# Patient Record
Sex: Female | Born: 1945 | Race: White | Hispanic: No | Marital: Married | Smoking: Former smoker
Health system: Southern US, Community
[De-identification: ages and names within clinical notes are randomized; demographics above are authoritative.]

## PROBLEM LIST (undated history)

## (undated) DIAGNOSIS — L97929 Non-pressure chronic ulcer of unspecified part of left lower leg with unspecified severity: Secondary | ICD-10-CM

## (undated) DIAGNOSIS — S8291XA Unspecified fracture of right lower leg, initial encounter for closed fracture: Secondary | ICD-10-CM

## (undated) DIAGNOSIS — I83029 Varicose veins of left lower extremity with ulcer of unspecified site: Secondary | ICD-10-CM

## (undated) DIAGNOSIS — I129 Hypertensive chronic kidney disease with stage 1 through stage 4 chronic kidney disease, or unspecified chronic kidney disease: Secondary | ICD-10-CM

## (undated) DIAGNOSIS — S32591A Other specified fracture of right pubis, initial encounter for closed fracture: Secondary | ICD-10-CM

## (undated) DIAGNOSIS — S82401A Unspecified fracture of shaft of right fibula, initial encounter for closed fracture: Secondary | ICD-10-CM

## (undated) DIAGNOSIS — I251 Atherosclerotic heart disease of 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: Secondary | ICD-10-CM

## (undated) DIAGNOSIS — K5900 Constipation, unspecified: Secondary | ICD-10-CM

## (undated) DIAGNOSIS — M810 Age-related osteoporosis without current pathological fracture: Secondary | ICD-10-CM

## (undated) DIAGNOSIS — E559 Vitamin D deficiency, unspecified: Secondary | ICD-10-CM

## (undated) DIAGNOSIS — R609 Edema, unspecified: Secondary | ICD-10-CM

## (undated) DIAGNOSIS — G35 Multiple sclerosis: Secondary | ICD-10-CM

## (undated) DIAGNOSIS — E042 Nontoxic multinodular goiter: Secondary | ICD-10-CM

## (undated) DIAGNOSIS — R7303 Prediabetes: Secondary | ICD-10-CM

## (undated) DIAGNOSIS — N183 Chronic kidney disease, stage 3 unspecified: Secondary | ICD-10-CM

## (undated) DIAGNOSIS — N289 Disorder of kidney and ureter, unspecified: Secondary | ICD-10-CM

## (undated) DIAGNOSIS — C50919 Malignant neoplasm of unspecified site of unspecified female breast: Secondary | ICD-10-CM

## (undated) DIAGNOSIS — N879 Dysplasia of cervix uteri, unspecified: Secondary | ICD-10-CM

## (undated) DIAGNOSIS — S82201A Unspecified fracture of shaft of right tibia, initial encounter for closed fracture: Secondary | ICD-10-CM

## (undated) DIAGNOSIS — E039 Hypothyroidism, unspecified: Secondary | ICD-10-CM

## (undated) DIAGNOSIS — E669 Obesity, unspecified: Secondary | ICD-10-CM

## (undated) DIAGNOSIS — D649 Anemia, unspecified: Secondary | ICD-10-CM

## (undated) DIAGNOSIS — E049 Nontoxic goiter, unspecified: Secondary | ICD-10-CM

## (undated) DIAGNOSIS — J209 Acute bronchitis, unspecified: Secondary | ICD-10-CM

## (undated) DIAGNOSIS — E041 Nontoxic single thyroid nodule: Secondary | ICD-10-CM

## (undated) DIAGNOSIS — Z8639 Personal history of other endocrine, nutritional and metabolic disease: Secondary | ICD-10-CM

## (undated) DIAGNOSIS — I10 Essential (primary) hypertension: Secondary | ICD-10-CM

## (undated) HISTORY — DX: Essential (primary) hypertension: I10

## (undated) HISTORY — DX: Non-pressure chronic ulcer of unspecified part of left lower leg with unspecified severity: L97.929

## (undated) HISTORY — DX: Hypertensive chronic kidney disease with stage 1 through stage 4 chronic kidney disease, or unspecified chronic kidney disease: I12.9

## (undated) HISTORY — DX: Acute bronchitis, unspecified: J20.9

## (undated) HISTORY — DX: Hypothyroidism, unspecified: E03.9

## (undated) HISTORY — DX: Unspecified fracture of right lower leg, initial encounter for closed fracture: S82.91XA

## (undated) HISTORY — DX: Personal history of other endocrine, nutritional and metabolic disease: Z86.39

## (undated) HISTORY — DX: Constipation, unspecified: K59.00

## (undated) HISTORY — PX: CATARACT EXTRACTION, BILATERAL: SHX1313

## (undated) HISTORY — DX: Chronic kidney disease, stage 3 unspecified: N18.30

## (undated) HISTORY — DX: Disorder of kidney and ureter, unspecified: N28.9

## (undated) HISTORY — DX: Varicose veins of left lower extremity with ulcer of unspecified site: I83.029

## (undated) HISTORY — DX: Dysplasia of cervix uteri, unspecified: N87.9

## (undated) HISTORY — DX: Prediabetes: R73.03

## (undated) HISTORY — DX: Nontoxic multinodular goiter: E04.2

## (undated) HISTORY — DX: Atherosclerotic heart disease of 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: I25.10

## (undated) HISTORY — DX: Anemia, unspecified: D64.9

## (undated) HISTORY — DX: Other specified fracture of right pubis, initial encounter for closed fracture: S32.591A

## (undated) HISTORY — DX: Unspecified fracture of shaft of right tibia, initial encounter for closed fracture: S82.201A

## (undated) HISTORY — DX: Age-related osteoporosis without current pathological fracture: M81.0

## (undated) HISTORY — DX: Edema, unspecified: R60.9

## (undated) HISTORY — DX: Vitamin D deficiency, unspecified: E55.9

## (undated) HISTORY — DX: Unspecified fracture of shaft of right fibula, initial encounter for closed fracture: S82.401A

## (undated) HISTORY — DX: Malignant neoplasm of unspecified site of unspecified female breast: C50.919

## (undated) HISTORY — PX: OTHER SURGICAL HISTORY: SHX169

## (undated) HISTORY — DX: Nontoxic goiter, unspecified: E04.9

## (undated) HISTORY — DX: Multiple sclerosis: G35

## (undated) HISTORY — DX: Obesity, unspecified: E66.9

---

## 1971-05-09 HISTORY — PX: CERVICAL CONIZATION W/BX: SHX1330

## 1997-09-23 ENCOUNTER — Ambulatory Visit (HOSPITAL_COMMUNITY): Admission: RE | Admit: 1997-09-23 | Discharge: 1997-09-23 | Payer: Self-pay | Admitting: Family Medicine

## 1998-06-25 ENCOUNTER — Other Ambulatory Visit: Admission: RE | Admit: 1998-06-25 | Discharge: 1998-06-25 | Payer: Self-pay | Admitting: Obstetrics and Gynecology

## 1999-05-10 ENCOUNTER — Other Ambulatory Visit: Admission: RE | Admit: 1999-05-10 | Discharge: 1999-05-10 | Payer: Self-pay | Admitting: Radiology

## 1999-06-06 ENCOUNTER — Other Ambulatory Visit: Admission: RE | Admit: 1999-06-06 | Discharge: 1999-06-06 | Payer: Self-pay | Admitting: *Deleted

## 1999-06-08 ENCOUNTER — Encounter (INDEPENDENT_AMBULATORY_CARE_PROVIDER_SITE_OTHER): Payer: Self-pay | Admitting: Specialist

## 1999-06-08 ENCOUNTER — Other Ambulatory Visit: Admission: RE | Admit: 1999-06-08 | Discharge: 1999-06-08 | Payer: Self-pay | Admitting: Radiology

## 1999-11-11 ENCOUNTER — Encounter: Admission: RE | Admit: 1999-11-11 | Discharge: 1999-11-11 | Payer: Self-pay | Admitting: Family Medicine

## 1999-11-11 ENCOUNTER — Encounter: Payer: Self-pay | Admitting: Family Medicine

## 2000-05-08 HISTORY — PX: KNEE ARTHROSCOPY W/ MENISCAL REPAIR: SHX1877

## 2000-06-06 ENCOUNTER — Other Ambulatory Visit: Admission: RE | Admit: 2000-06-06 | Discharge: 2000-06-06 | Payer: Self-pay | Admitting: *Deleted

## 2000-06-13 ENCOUNTER — Encounter (INDEPENDENT_AMBULATORY_CARE_PROVIDER_SITE_OTHER): Payer: Self-pay

## 2000-06-13 ENCOUNTER — Other Ambulatory Visit: Admission: RE | Admit: 2000-06-13 | Discharge: 2000-06-13 | Payer: Self-pay | Admitting: *Deleted

## 2001-02-12 ENCOUNTER — Ambulatory Visit (HOSPITAL_BASED_OUTPATIENT_CLINIC_OR_DEPARTMENT_OTHER): Admission: RE | Admit: 2001-02-12 | Discharge: 2001-02-12 | Payer: Self-pay | Admitting: Orthopaedic Surgery

## 2001-06-19 ENCOUNTER — Other Ambulatory Visit: Admission: RE | Admit: 2001-06-19 | Discharge: 2001-06-19 | Payer: Self-pay | Admitting: *Deleted

## 2002-07-08 ENCOUNTER — Other Ambulatory Visit: Admission: RE | Admit: 2002-07-08 | Discharge: 2002-07-08 | Payer: Self-pay | Admitting: Obstetrics and Gynecology

## 2003-07-09 ENCOUNTER — Other Ambulatory Visit: Admission: RE | Admit: 2003-07-09 | Discharge: 2003-07-09 | Payer: Self-pay | Admitting: Obstetrics and Gynecology

## 2004-07-26 ENCOUNTER — Other Ambulatory Visit: Admission: RE | Admit: 2004-07-26 | Discharge: 2004-07-26 | Payer: Self-pay | Admitting: Obstetrics and Gynecology

## 2005-07-27 ENCOUNTER — Other Ambulatory Visit: Admission: RE | Admit: 2005-07-27 | Discharge: 2005-07-27 | Payer: Self-pay | Admitting: Obstetrics and Gynecology

## 2006-12-06 ENCOUNTER — Encounter (INDEPENDENT_AMBULATORY_CARE_PROVIDER_SITE_OTHER): Payer: Self-pay | Admitting: Surgery

## 2006-12-06 ENCOUNTER — Ambulatory Visit (HOSPITAL_BASED_OUTPATIENT_CLINIC_OR_DEPARTMENT_OTHER): Admission: RE | Admit: 2006-12-06 | Discharge: 2006-12-06 | Payer: Self-pay | Admitting: Surgery

## 2006-12-16 ENCOUNTER — Encounter: Admission: RE | Admit: 2006-12-16 | Discharge: 2006-12-16 | Payer: Self-pay | Admitting: Surgery

## 2006-12-26 ENCOUNTER — Ambulatory Visit: Admission: RE | Admit: 2006-12-26 | Discharge: 2007-01-29 | Payer: Self-pay | Admitting: Radiation Oncology

## 2007-01-23 ENCOUNTER — Ambulatory Visit: Payer: Self-pay | Admitting: Oncology

## 2007-01-28 ENCOUNTER — Encounter (INDEPENDENT_AMBULATORY_CARE_PROVIDER_SITE_OTHER): Payer: Self-pay | Admitting: Surgery

## 2007-01-28 ENCOUNTER — Ambulatory Visit (HOSPITAL_BASED_OUTPATIENT_CLINIC_OR_DEPARTMENT_OTHER): Admission: RE | Admit: 2007-01-28 | Discharge: 2007-01-28 | Payer: Self-pay | Admitting: Surgery

## 2007-02-06 ENCOUNTER — Encounter (HOSPITAL_COMMUNITY): Admission: RE | Admit: 2007-02-06 | Discharge: 2007-03-08 | Payer: Self-pay | Admitting: Oncology

## 2007-02-06 ENCOUNTER — Ambulatory Visit (HOSPITAL_COMMUNITY): Payer: Self-pay | Admitting: Oncology

## 2007-02-06 DIAGNOSIS — C50919 Malignant neoplasm of unspecified site of unspecified female breast: Secondary | ICD-10-CM

## 2007-02-06 HISTORY — PX: MASTECTOMY, RADICAL: SHX710

## 2007-02-06 HISTORY — DX: Malignant neoplasm of unspecified site of unspecified female breast: C50.919

## 2007-02-08 ENCOUNTER — Ambulatory Visit (HOSPITAL_COMMUNITY): Admission: RE | Admit: 2007-02-08 | Discharge: 2007-02-08 | Payer: Self-pay | Admitting: Oncology

## 2007-03-04 ENCOUNTER — Ambulatory Visit (HOSPITAL_COMMUNITY): Admission: RE | Admit: 2007-03-04 | Discharge: 2007-03-05 | Payer: Self-pay | Admitting: Surgery

## 2007-03-04 ENCOUNTER — Encounter (INDEPENDENT_AMBULATORY_CARE_PROVIDER_SITE_OTHER): Payer: Self-pay | Admitting: Surgery

## 2007-03-27 ENCOUNTER — Ambulatory Visit (HOSPITAL_COMMUNITY): Payer: Self-pay | Admitting: Oncology

## 2007-05-16 ENCOUNTER — Encounter: Admission: RE | Admit: 2007-05-16 | Discharge: 2007-05-16 | Payer: Self-pay | Admitting: Endocrinology

## 2007-06-07 ENCOUNTER — Other Ambulatory Visit: Admission: RE | Admit: 2007-06-07 | Discharge: 2007-06-07 | Payer: Self-pay | Admitting: Endocrinology

## 2007-09-09 ENCOUNTER — Ambulatory Visit (HOSPITAL_COMMUNITY): Payer: Self-pay | Admitting: Oncology

## 2008-03-16 ENCOUNTER — Ambulatory Visit (HOSPITAL_COMMUNITY): Payer: Self-pay | Admitting: Oncology

## 2008-09-07 ENCOUNTER — Encounter (HOSPITAL_COMMUNITY): Admission: RE | Admit: 2008-09-07 | Discharge: 2008-10-07 | Payer: Self-pay | Admitting: Oncology

## 2008-09-07 ENCOUNTER — Ambulatory Visit (HOSPITAL_COMMUNITY): Payer: Self-pay | Admitting: Oncology

## 2009-03-31 ENCOUNTER — Ambulatory Visit (HOSPITAL_COMMUNITY): Payer: Self-pay | Admitting: Oncology

## 2009-09-29 ENCOUNTER — Encounter (HOSPITAL_COMMUNITY): Admission: RE | Admit: 2009-09-29 | Discharge: 2009-10-29 | Payer: Self-pay | Admitting: Oncology

## 2009-09-29 ENCOUNTER — Ambulatory Visit (HOSPITAL_COMMUNITY): Payer: Self-pay | Admitting: Oncology

## 2010-03-30 ENCOUNTER — Encounter (HOSPITAL_COMMUNITY)
Admission: RE | Admit: 2010-03-30 | Discharge: 2010-04-29 | Payer: Self-pay | Source: Home / Self Care | Attending: Oncology | Admitting: Oncology

## 2010-03-30 ENCOUNTER — Ambulatory Visit (HOSPITAL_COMMUNITY): Payer: Self-pay | Admitting: Oncology

## 2010-05-29 ENCOUNTER — Encounter: Payer: Self-pay | Admitting: Surgery

## 2010-07-19 LAB — COMPREHENSIVE METABOLIC PANEL
ALT: 16 U/L (ref 0–35)
Alkaline Phosphatase: 61 U/L (ref 39–117)
BUN: 14 mg/dL (ref 6–23)
Chloride: 101 mEq/L (ref 96–112)
Glucose, Bld: 145 mg/dL — ABNORMAL HIGH (ref 70–99)
Potassium: 3.8 mEq/L (ref 3.5–5.1)
Sodium: 138 mEq/L (ref 135–145)
Total Bilirubin: 0.7 mg/dL (ref 0.3–1.2)

## 2010-07-19 LAB — DIFFERENTIAL
Basophils Absolute: 0 10*3/uL (ref 0.0–0.1)
Basophils Relative: 0 % (ref 0–1)
Eosinophils Absolute: 0 10*3/uL (ref 0.0–0.7)
Monocytes Absolute: 0.6 10*3/uL (ref 0.1–1.0)
Neutro Abs: 5.1 10*3/uL (ref 1.7–7.7)
Neutrophils Relative %: 79 % — ABNORMAL HIGH (ref 43–77)

## 2010-07-19 LAB — CBC
HCT: 42.2 % (ref 36.0–46.0)
Hemoglobin: 15.9 g/dL — ABNORMAL HIGH (ref 12.0–15.0)
MCV: 81.3 fL (ref 78.0–100.0)
RBC: 5.19 MIL/uL — ABNORMAL HIGH (ref 3.87–5.11)
WBC: 6.5 10*3/uL (ref 4.0–10.5)

## 2010-07-25 LAB — DIFFERENTIAL
Basophils Relative: 0 % (ref 0–1)
Eosinophils Absolute: 0 10*3/uL (ref 0.0–0.7)
Eosinophils Relative: 1 % (ref 0–5)
Lymphs Abs: 0.7 10*3/uL (ref 0.7–4.0)
Monocytes Absolute: 0.6 10*3/uL (ref 0.1–1.0)
Monocytes Relative: 12 % (ref 3–12)
Neutrophils Relative %: 72 % (ref 43–77)

## 2010-07-25 LAB — COMPREHENSIVE METABOLIC PANEL
ALT: 16 U/L (ref 0–35)
AST: 23 U/L (ref 0–37)
Albumin: 4.1 g/dL (ref 3.5–5.2)
Alkaline Phosphatase: 59 U/L (ref 39–117)
Calcium: 9.5 mg/dL (ref 8.4–10.5)
GFR calc Af Amer: >60 mL/min (ref >60)
Glucose, Bld: 142 mg/dL — ABNORMAL HIGH (ref 70–99)
Potassium: 3.2 mEq/L — ABNORMAL LOW (ref 3.5–5.1)
Sodium: 138 mEq/L (ref 135–145)
Total Protein: 7 g/dL (ref 6.0–8.3)

## 2010-07-25 LAB — CBC
Hemoglobin: 16.1 g/dL — ABNORMAL HIGH (ref 12.0–15.0)
MCHC: 35.9 g/dL (ref 30.0–36.0)
RBC: 5.25 MIL/uL — ABNORMAL HIGH (ref 3.87–5.11)
RDW: 12.9 % (ref 11.5–15.5)

## 2010-08-16 LAB — COMPREHENSIVE METABOLIC PANEL
ALT: 12 U/L (ref 0–35)
AST: 19 U/L (ref 0–37)
CO2: 23 mEq/L (ref 19–32)
Calcium: 9.4 mg/dL (ref 8.4–10.5)
Creatinine, Ser: 0.88 mg/dL (ref 0.4–1.2)
GFR calc Af Amer: >60 mL/min (ref >60)
GFR calc non Af Amer: >60 mL/min (ref >60)
Sodium: 135 mEq/L (ref 135–145)
Total Protein: 6.9 g/dL (ref 6.0–8.3)

## 2010-08-16 LAB — TSH: TSH: 0.424 u[IU]/mL (ref 0.350–4.500)

## 2010-08-16 LAB — DIFFERENTIAL
Eosinophils Absolute: 0 10*3/uL (ref 0.0–0.7)
Eosinophils Relative: 0 % (ref 0–5)
Lymphocytes Relative: 15 % (ref 12–46)
Lymphs Abs: 0.9 10*3/uL (ref 0.7–4.0)
Monocytes Relative: 8 % (ref 3–12)

## 2010-08-16 LAB — VITAMIN D 1,25 DIHYDROXY
Vitamin D 1, 25 (OH)2 Total: 22 pg/mL (ref 18–72)
Vitamin D3 1, 25 (OH)2: 22 pg/mL

## 2010-08-16 LAB — CBC
MCHC: 35.1 g/dL (ref 30.0–36.0)
MCV: 88.2 fL (ref 78.0–100.0)
Platelets: 253 10*3/uL (ref 150–400)
RDW: 12.8 % (ref 11.5–15.5)

## 2010-08-16 LAB — VITAMIN D 25 HYDROXY (VIT D DEFICIENCY, FRACTURES): Vit D, 25-Hydroxy: 49 ng/mL (ref 30–89)

## 2010-08-16 LAB — SEDIMENTATION RATE: Sed Rate: 0 mm/hr (ref 0–22)

## 2010-09-20 NOTE — Op Note (Signed)
Colleen [name], Colleen [name]                 ACCOUNT NO.:  [id number]   MEDICAL RECORD NO.:  [id number]          PATIENT TYPE:  OIB   LOCATION:  0098                         FACILITY:  [facility]   PHYSICIAN:  Colleen [name], M.D.DATE OF BIRTH:  Dec 31, 1945   DATE OF PROCEDURE:  03/04/2007  DATE OF DISCHARGE:                               OPERATIVE REPORT   PREOPERATIVE DIAGNOSIS:  Stage II right breast cancer status post  lumpectomy and sentinel node biopsy.   POSTOPERATIVE DIAGNOSIS:  Stage II right breast cancer status post  lumpectomy and sentinel node biopsy.   OPERATION:  Right modified mastectomy with left prophylactic total  mastectomy.   SURGEON:  Dr. Jamey [name].   ASSISTANT:  Dr. Luisa [name].   ANESTHESIA:  General.   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  Her mammograms have been quite difficult to interpret and  the patient turned out to have a positive lymph node despite finding  only DCIS in the right breast.  She elected to have a right total  mastectomy with axillary dissection.  Because of her difficulty with  diagnosis and the fact she has extremely dense breasts, so difficult to  follow, she wished to have a left prophylactic total mastectomy.  This  was discussed both myself and her oncologist and we both agreed that was  appropriate management for her.   DESCRIPTION OF PROCEDURE:  The patient was seen in the holding area and  she had no further questions.  We identified the right axilla as the  site for the node dissection.  The patient taken to the operating room  after a satisfactory general endotracheal anesthesia had been obtained,  both breasts were prepped and draped as a sterile field.  Time-out was  done.   The left side was done first.  The patient has fairly large breasts so  we made a long incision taking a large amount skin so that we would  have  a fairly snug closure without a lot of redundant skin.  I raised a skin  flap medially to the sternum, superiorly to the clavicle, laterally into  the axilla and latissimus and inferiorly to below the inframammary fold.  The breast was removed from medial to lateral using coagulation current  of the cautery.  The axilla itself was not entered, although I did see  one approximately 1.5 cm soft node then came out with the breast and was  really in the axillary tail of Spence intramammary lymph node.   I spent several minutes irrigating making sure everything was dry.  I  took some time to side to tailor the subcutaneous tissue of the skin  flaps so that they would be fairly smooth.  Two 19 Blake drains were  placed.  The wound was again irrigated and checked for hemostasis  and  at this point appeared to be dry so the incision was closed with  staples.  The drains were secured with 2-0 nylons.  They were charged up  and at the end of the case there was no significant bleeding.   Attention was turned to the right side and skin flaps were made similar  to the left, although I went a little bit more laterally since she had  had a laterally placed incision, I wanted to be well beyond that.  Skin  flaps were done similarly.  As I was removing the breast from medial to  lateral the clavipectoral fascia was incised and the axilla entered.  I  identified the axillary vein and stripped the axillary contents out  primarily using the harmonic to divide the fatty tissue to see if this  would reduce some of the lymphatic leak.  I took care to avoid the long  thoracic thoracodorsal nerves.  The thoracodorsal vessels were somewhat  tethered up to the prior area of the sentinel node biopsy and this took  a few minutes to sort out the anatomy but I was able to dissect those  off let them retract back to their normal position.  At the end both  long thoracic and thoracodorsal nerves functioned when  stimulated.   Again I irrigated made sure everything was dry.  I placed two 19 Blake  drains.  A final irrigation was done and I began to close.  When I got  the wound almost closed I noticed that laterally we did still have a lot  of excess skin even though I had on both sides tacked the subcu down to  the muscle.  I then extended the incision to take out some this excess  skin to see if this would not give her a little smoother area was less  excess material out in the right axilla.   The patient tolerated procedure well.  The estimated blood loss was 300  mL.  There no operative complications.  All counts were correct.      Colleen [name], M.D.  Electronically Signed     CJS/MEDQ  D:  03/04/2007  T:  03/04/2007  Job:  [id number]   cc:   Colleen [name], M.D.  Fax: [phone]   Colleen [name], M.D.   Colleen Horns. Mariel Sleet, MD  Fax: [phone]

## 2010-09-20 NOTE — Op Note (Signed)
Colleen [name], Colleen [name]                 ACCOUNT NO.:  [id number]   MEDICAL RECORD NO.:  [id number]          PATIENT TYPE:  AMB   LOCATION:  DSC                          FACILITY:  [facility]   PHYSICIAN:  Currie Paris, M.D.DATE OF BIRTH:  08-29-1945   DATE OF PROCEDURE:  12/06/2006  DATE OF DISCHARGE:                               OPERATIVE REPORT   PREOPERATIVE DIAGNOSIS:  Breast calcifications with CAPSS on core  biopsy.   POSTOPERATIVE DIAGNOSIS:  Breast calcifications with CAPSS on core  biopsy.   PROCEDURE PERFORMED:  Needle guided excisional biopsy of right breast.   SURGEON:  Currie Paris, M.D.   ANESTHESIA:  General.   CLINICAL HISTORY:  This is a 65 year old lady who has had multiple  calcifications.  A recent biopsy was done because of some increase and  this showed some probably benign tissue but, because of some mild  abnormalities, an excisional biopsy was suggested.  A clip had been  placed at the time of the core biopsy to help localize this area.   DESCRIPTION OF PROCEDURE:  When the patient was seen in the holding  area, she had no further questions.  We identified the right side as the  operative side and I initialed that.  I reviewed the mammogram films and  the guide wire entered laterally, tracked medially, and went pretty much  through a long area of calcifications.  I have also spoken with Dr.  Lucious Groves about the mammogram as he had placed the guide wire.   The patient was then taken to the operating room.  After satisfactory  anesthesia had been obtained, the right breast was prepped and draped.  Timeout occurred.  I made a transverse incision starting at the guide  wire and going towards the nipple areolar area.  The guide wire entered  almost at the lateral margin of the breast.  I then manipulated the  guide wire into the wound and put two Allis clamps on the breast tissue  on either side of the guide wire for traction, and took a large generous  cylinder of tissue around the guide wire going all the way until I was  subareolar and beyond the tip of the guide wire.  I thought I had the  marking clip plus probably the majority of the calcifications within  this specimen, although they did scatter throughout the breast.  I sent  this for specimen mammogram.   I irrigated and made sure everything was dry.  I closed with 3-0 Vicryl  and 4-0 Monocryl subcuticular plus Dermabond.  Radiology reported that  the clip and the calcifications appeared to be well contained in the  specimen mammogram.   The patient tolerated the procedure well and there were no operative  complications.  All counts were correct.      Currie Paris, M.D.  Electronically Signed     CJS/MEDQ  D:  12/06/2006  T:  12/06/2006  Job:  [id number]   cc:   Evelena Peat, M.D.  Huel Cote, M.D.  Jeralyn Ruths, M.D.

## 2010-09-20 NOTE — Op Note (Signed)
[name], [name]                 ACCOUNT NO.:  [id number]   MEDICAL RECORD NO.:  [id number]          PATIENT TYPE:  AMB   LOCATION:  DSC                          FACILITY:  [facility]   PHYSICIAN:  Currie Paris, M.D.DATE OF BIRTH:  April 01, 1946   DATE OF PROCEDURE:  DATE OF DISCHARGE:                               OPERATIVE REPORT   Office medical record number:  CCS [id number].   PREOPERATIVE DIAGNOSIS:  Ductal carcinoma in situ, right breast lateral.   POSTOPERATIVE DIAGNOSIS:  Ductal carcinoma in situ, right breast  lateral.   OPERATION:  Re-excision of right lumpectomy site (partial mastectomy),  blue dye injection and axillary sentinel lymph node biopsy.   SURGEON:  Currie Paris, M.D.   ANESTHESIA:  General.   CLINICAL HISTORY:  This is a 65 year old lady who had a wide excisional  biopsy of an area of abnormality in the right breast laterally.  She had  what appeared to be low grade DCIS with involvement of the anterior  margin and close margin superior and deep.  Her mammogram showed a wide  area of calcifications.  After discussion with both myself and radiation  oncology, she elected to proceed to a wider excision to see if we could  achieve negative margins.  In addition, she requested that we do a  sentinel lymph node biopsy in case metastatic disease happened to be  found, although we did discuss the fact that this has a low potential to  be positve given the noninvasive nature of her tumor.   DESCRIPTION OF PROCEDURE:  The patient was seen in the holding area and  reviewed the plans for surgery again.  I think all questions were  answered.   The right breast was marked by myself and the patient on the operative  site.  She was then taken to the operating room.  After satisfactory  general endotracheal anesthesia had been obtained, the time-out was  done.   I prepped the nipple-areolar area with alcohol and injected 5 mL of  dilute methylene blue, mostly  around subareloar but a little bit over by  the scar skin overlying the area of the primary.  The breast was then  prepped with Betadine and sterilely draped.   Using the Neoprobe, I identified a hot area in the axilla.  I made a  transverse incision and divided some of the subcutaneous tissues  with  cautery.  I initially found a blue lymphatic leading to a blue lymph  node right in the area that had been hot and this was removed with  cautery.  Had counts as high as 380.  Using the Neoprobe, I identified a  just slightly deeper area that was also hot and further dissection  revealed a second lymph node, this one with no blue dye and this was  excised.  Both nodes were clinically benign.   With this second node removed, the counts dropped to 0 to 1 in the  axilla.  I saw no other blue dye entering the axilla and palpated no  enlarged lymph nodes.  I injected  some Marcaine to help with postop pain  relief.  I placed a moist pack waiting for pathology.   Attention was turned back to the breast.  I made an elliptical incision  and an excising the entire scar, but primarily extending a little bit  more medially towards the nipple since that was the general direction of  the calcifications.  I then raised some flaps superiorly, medially and  then laterally went down to the chest wall and came under the area,  lifted it all up and then finally excised and divided the tissue  medially, going up under the areola so that I had tissue removed all the  way over to the nipple-areolar complex.  Some of this had post surgical  changes, but most of it looked like simple fatty and fibrofatty tissue.  There appeared to be a small remnant of tissue left on the muscle deep  so I went ahead and excised that as a separate piece of tissue to make  sure that we had a complete deep margin.   At this point I irrigated and made sure everything was dry.  I put more  Marcaine in to help with postop pain relief  here.  I closed the very  deep layers to cover the muscle with some 3-0 Vicryl and as I could  without distorting breast closed some of the other areas layers of the  breast and finally skin and subcu.  The skin was closed with 4-0  Monocryl subcuticular and finally Dermabond.   Pathology reported that the sentinel nodes were both negative.  At this  point I went back into the axilla.  It remained completely dry while we  were doing the re-excision and I closed this in layers with 3-0 Vicryl,  4-0 Monocryl subcuticular and Dermabond.   The patient tolerated procedure well.  There were no operative  complications.  All counts were correct.  Estimated blood loss was less  than 10 mL.      Currie Paris, M.D.  Electronically Signed     CJS/MEDQ  D:  01/28/2007  T:  01/28/2007  Job:  [id number]   cc:   Evelena Peat, M.D.  Huel Cote, M.D.  Ladona Horns. Mariel Sleet, MD  Jeralyn Ruths, M.D.

## 2010-09-23 NOTE — Op Note (Signed)
[facility]  Patient:    ARLETT, GOOLD [facility] Visit Number: [id number] MRN: [id number]          Service Type: DSU Location: [facility] Attending Physician:  Marcene Corning Dictated by:   Lubertha Basque. Jerl Santos, M.D. Proc. Date: 02/12/01 Admit Date:  02/12/2001                             Operative Report  PREOPERATIVE DIAGNOSIS: 1. Right knee degenerative arthritis. 2. Right knee torn medial meniscus.  POSTOPERATIVE DIAGNOSIS: 1. Right knee degenerative arthritis. 2. Right knee torn medial meniscus.  OPERATION PERFORMED: 1. Right knee chondroplasty patella and medial femoral condyle. 2. Right knee partial medial meniscectomy.  ANESTHESIA:  Knee block and MAC.  ATTENDING SURGEON:  Lubertha Basque. Jerl Santos, M.D.  ASSISTANT:  Lindwood Qua, P.A.  INDICATIONS FOR PROCEDURE:  The patient is a 65 year old woman with a long history of knee pain. This has become much worse over recent months.  This has persisted despite several injections and oral anti-inflammatories and glucosamine.  She has also tried a brace.  By x-ray and MRI, she had some mild degenerative change.  Planned procedure at this point is for an arthroscopy. The procedure was discussed with the patient and informed operative consent was obtained after discussion of possible complications of reaction to anesthesia and infection.  DESCRIPTION OF PROCEDURE:  The patient was taken to an operating suite where knee block anesthesia was applied along with some MAC.  She was positioned supine and draped in normal sterile fashion.  After administration of preop intravenous antibiotics, arthroscopy of the right knee was performed through a total of two portals.  The suprapatellar pouch was benign while the patellofemoral joint showed a large area of grade 3 change mostly under the patella.  A thorough chondroplasty was performed.  The patella actually tracked fairly well. The medial compartment  similarly had grade three change in a large portion of the medial femoral condyle which required a chondroplasty.  She also had a degenerative tear of the middle horn of the medial meniscus which was addressed with a 5% partial medial meniscectomy. The ACL and the PCL were intact.  The lateral compartment showed some softening of the articular cartilage on the lateral tibial plateau but the meniscus itself was intact.  The knee was thoroughly irrigated at the end of the case followed by placement of Marcaine with epinephrine and morphine. Depo-Medrol was also injected.  Adaptic was placed over a portal followed by dry gauze and a loose Ace wrap.  Estimated blood loss and intraoperative fluids can be obtained from Anesthesia records.  DISPOSITION:  The patient was extubated in the operating room and taken to the recovery room in stable condition.  Plans were to go home the same day and to follow up in the office in less than a week.  I will contact her by phone tonight. Dictated by:   Lubertha Basque Jerl Santos, M.D. Attending Physician:  Marcene Corning DD:  02/12/01 TD:  02/12/01 Job: [id number]

## 2010-09-28 ENCOUNTER — Encounter (HOSPITAL_COMMUNITY): Payer: 59 | Attending: Oncology | Admitting: Oncology

## 2010-09-28 DIAGNOSIS — C439 Malignant melanoma of skin, unspecified: Secondary | ICD-10-CM

## 2011-01-25 ENCOUNTER — Other Ambulatory Visit: Payer: Self-pay | Admitting: Internal Medicine

## 2011-01-25 ENCOUNTER — Ambulatory Visit
Admission: RE | Admit: 2011-01-25 | Discharge: 2011-01-25 | Disposition: A | Discharge status: Home / Self Care | Payer: 59 | Source: Ambulatory Visit | Attending: Internal Medicine | Admitting: Internal Medicine

## 2011-01-25 DIAGNOSIS — E042 Nontoxic multinodular goiter: Secondary | ICD-10-CM

## 2011-02-15 LAB — DIFFERENTIAL
Basophils Relative: 1
Eosinophils Absolute: 0.1
Eosinophils Relative: 2
Monocytes Absolute: 0.6
Monocytes Relative: 12 — ABNORMAL HIGH
Neutro Abs: 2.8

## 2011-02-15 LAB — BASIC METABOLIC PANEL
CO2: 27
Chloride: 100
GFR calc Af Amer: >60
Glucose, Bld: 87
Sodium: 138

## 2011-02-15 LAB — URINALYSIS, ROUTINE W REFLEX MICROSCOPIC
Bilirubin Urine: NEGATIVE
Nitrite: NEGATIVE
Protein, ur: NEGATIVE
Urobilinogen, UA: 0.2

## 2011-02-15 LAB — CBC
HCT: 44.4
Hemoglobin: 15.5 — ABNORMAL HIGH
MCHC: 34.8
MCV: 87.2
RBC: 5.09

## 2011-02-16 LAB — T4, FREE: Free T4: 1.62

## 2011-02-16 LAB — COMPREHENSIVE METABOLIC PANEL
ALT: 17
BUN: 20
CO2: 26
Calcium: 9.4
GFR calc non Af Amer: 59 — ABNORMAL LOW
Glucose, Bld: 113 — ABNORMAL HIGH
Sodium: 137

## 2011-02-16 LAB — BASIC METABOLIC PANEL
BUN: 19
Chloride: 102
Creatinine, Ser: 0.91
Glucose, Bld: 85
Potassium: 4.4

## 2011-02-20 LAB — BASIC METABOLIC PANEL
CO2: 30
Chloride: 101
GFR calc non Af Amer: >60
Glucose, Bld: 80
Potassium: 4
Sodium: 135

## 2011-03-15 ENCOUNTER — Encounter (HOSPITAL_COMMUNITY): Payer: 59 | Attending: Oncology | Admitting: Oncology

## 2011-03-15 ENCOUNTER — Encounter (HOSPITAL_COMMUNITY): Payer: Self-pay | Admitting: Oncology

## 2011-03-15 VITALS — BP 132/81 | HR 102 | Temp 97.8°F | Wt 216.8 lb

## 2011-03-15 DIAGNOSIS — C50919 Malignant neoplasm of unspecified site of unspecified female breast: Secondary | ICD-10-CM | POA: Insufficient documentation

## 2011-03-15 DIAGNOSIS — G35 Multiple sclerosis: Secondary | ICD-10-CM

## 2011-03-15 NOTE — Progress Notes (Signed)
Dict

## 2011-03-15 NOTE — Progress Notes (Signed)
CC:   Hal T. Stoneking, M.D. Currie Paris, M.D. Harlen Labs, MD  DIAGNOSES: 1. Microscopically-invasive breast cancer on the right with ductal     carcinoma in situ as the predominant lesion.  She had a     micropapillary pattern of the invasive disease.  Two out of 17     nodes were actually involved with microscopic metastases, less than     2 mm, too small to analyze for ER or PR receptors, but the ductal     carcinoma in situ, however, was ER-positive at 99%, PR-positive at     36%, and she had definitive surgery on 03/04/2007 consisting of     bilateral mastectomies and she is without recurrence thus far on     adjuvant Arimidex, which she started on 04/18/2007.  The ductal     carcinoma in situ component was actually 4.5 cm across.  No     lymphovascular invasion was found, margins were clear, and the     invasive tumor was again felt to be low-grade, as was the ductal     carcinoma in situ. 2. Acute progressive multiple sclerosis, now, of course, using a     walker, still driving and still working. 3. Obesity. 4. Hypertension. 5. Goiter, on Synthroid replacement. 6. Cervical conization for atypia in 1973. 7. Right [name] meniscal tear with partial removal in 2002. 8. Bone spur removed from the right large toe in the past. 9. Bilateral cataract operations within the last 7 years by Dr.     Nile Riggs. 10.Multiple benign breast biopsies in the past starting in her 40s. Colleen [name] is here today with her husband.  She has trouble getting in and out of the car.  It is easier to get in on the driver's side, she states, because of the steering wheel to stabilize her.  Her left leg is the weaker leg, and she still feels that she can drive safely when she drives to work, interestingly.  I am, of course, concerned about, however.  She has not had a response to any drug that was used for her MS.  She is still, of course, very bright, very alert.  She states she has to work because of  insurance purposes.  She is, of course, right now only 65.  Whether she will try to work when she gets on Medicare is another story, but I am not sure she should be driving completely.  I have asked her husband to discusses with Dr. Renne Crigler next time they see her.  She, of course, gets very, very slowly from the chair, usually needs some help, and she walks with her walker very, very slowly.  She needs help to get up from the supine position in the exam table.  PHYSICAL EXAMINATION:  Vital signs:  Weight is 216 pounds.  That compares to 203 pounds in May.  Blood pressure is 132/81 to day, left arm sitting position.  Pulse right around 90-100 and regular. Respirations 16-18 and unlabored.  She is afebrile.  She has no lymphadenopathy in the cervical, supraclavicular, infraclavicular, axillary, or inguinal areas.  The goiter is still palpable.  Lungs: Clear to auscultation and percussion.  Heart:  Shows a regular rhythm and rate without obvious murmur, rub, or gallop.  Both chest walls are clear and free of disease.  She has benign seborrheic keratoses but again, no recurrent disease.  Abdomen:  Obese, nontender, without obvious organomegaly.  Bowel sounds are normal.  She states that  she is not incontinent of stool.  She occasionally has a little urine leakage and wears a pad.  She does not have ankle edema or arm edema presently.  Colleen [name], of course, has been devastated by this MS.  Whether she should continue working is another story, but she feels that she has to.  We have 1 year to go on the Arimidex.  We will continue it for the entire year and then stop it.  I will see her in 6 months.  I did not do any labs on her today.    ______________________________ Ladona Horns. Mariel Sleet, MD ESN/MEDQ  D:  03/15/2011  T:  03/15/2011  Job:  [id number]

## 2011-03-15 NOTE — Patient Instructions (Signed)
[facility]  Discharge Instructions  RECOMMENDATIONS MADE BY THE CONSULTANT AND ANY TEST RESULTS WILL BE SENT TO YOUR REFERRING DOCTOR.   EXAM FINDINGS BY MD TODAY AND SIGNS AND SYMPTOMS TO REPORT TO CLINIC OR PRIMARY MD: No changes on treatment.  Report any new lumps, bone pain or shortness of breath.  MEDICATIONS PRESCRIBED: none      SPECIAL INSTRUCTIONS/FOLLOW-UP: Return to Clinic in 6 months to see Dr. Mariel Sleet   I acknowledge that I have been informed and understand all the instructions given to me and received a copy. I do not have any more questions at this time, but understand that I may call the Specialty Clinic at [facility] at [phone] during business hours should I have any further questions or need assistance in obtaining follow-up care.    __________________________________________  _____________  __________ Signature of Patient or Authorized Representative            Date                   Time    __________________________________________ Nurse's Signature

## 2011-09-12 ENCOUNTER — Encounter (HOSPITAL_COMMUNITY): Payer: 59 | Attending: Oncology | Admitting: Oncology

## 2011-09-12 VITALS — BP 120/82 | HR 98 | Temp 98.1°F | Wt 221.6 lb

## 2011-09-12 DIAGNOSIS — C50919 Malignant neoplasm of unspecified site of unspecified female breast: Secondary | ICD-10-CM

## 2011-09-12 DIAGNOSIS — G35 Multiple sclerosis: Secondary | ICD-10-CM

## 2011-09-12 DIAGNOSIS — I10 Essential (primary) hypertension: Secondary | ICD-10-CM

## 2011-09-12 NOTE — Patient Instructions (Signed)
Colleen [name]  [id number] 04/23/1946 Dr. Glenford Peers   [facility]  Discharge Instructions  RECOMMENDATIONS MADE BY THE CONSULTANT AND ANY TEST RESULTS WILL BE SENT TO YOUR REFERRING DOCTOR.   EXAM FINDINGS BY MD TODAY AND SIGNS AND SYMPTOMS TO REPORT TO CLINIC OR PRIMARY MD: Exam and discussion per MD.  Report any new lumps, bone pain or shortness of breath.  Will continue Arimidex for full 5 years then it can be stopped.  MEDICATIONS PRESCRIBED: none   INSTRUCTIONS GIVEN AND DISCUSSED:   SPECIAL INSTRUCTIONS/FOLLOW-UP: Return to Clinic in 6 months.   I acknowledge that I have been informed and understand all the instructions given to me and received a copy. I do not have any more questions at this time, but understand that I may call the Specialty Clinic at [facility] at [phone] during business hours should I have any further questions or need assistance in obtaining follow-up care.    __________________________________________  _____________  __________ Signature of Patient or Authorized Representative            Date                   Time    __________________________________________ Nurse's Signature

## 2011-09-12 NOTE — Progress Notes (Signed)
CC:   Hal T. Stoneking, M.D. Harlen Labs, MD  DIAGNOSES: 1. 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  Has a predominant lesion though she had a     micropapillary pattern of invasive disease with 2/17 lymph nodes     involved with microscopic metastases less than 2 mm, too small to     analyze for ER/PR receptors, but the DCIS, however, was ER positive     99% and PR positive 36%.  She had definitive surgery on 03/04/2007     consisting of bilateral mastectomies and is without recurrence thus     far on adjuvant Arimidex which will finish in December 2013.  The     DCIS component was actually 4.5 cm across.  No LVI was found.     Margins were clear and the invasive tumor was felt to be low grade     as was the DCIS. 2. Acute progressive multiple sclerosis.  Now with a walker and having     difficulty getting around more and more.  She does have an electric     wheelchair at work now which allows her to get out of her office. 3. Obesity. 4. Hypertension. 5. Goiter on Synthroid replacement. 6. Cervical conization for atypia in 1973. 7. Right knee meniscal tear with partial removal in 2002. 8. Bilateral cataract operations in the last 7 years to 8 years by Dr.     Nile Riggs. 9. Multiple benign breast biopsies in the past starting in her 40s.  She is here today with her husband Jonny Ruiz and just not doing as well from the neurological standpoint.  Her vital signs are basically the same but she is just having more and more of a difficult time giving getting around.  She is incontinent about 50% of the time whether it is urine or stool.  She is not losing weight.  She is not aware any lumps anywhere.  PHYSICAL EXAM:  Lymphatic:  No adenopathy in the cervical, supraclavicular, infraclavicular, axillary, or inguinal areas. Respiratory:  Both chest walls are clear.  Her lungs are clear.  Heart: Regular rhythm and rate without murmur, rub, or gallop.   Abdomen:  Soft and nontender without organomegaly.  She has no hepatosplenomegaly. Bowel sounds are normal.  Skin:  Numerous seborrheic keratoses on the right chest in particular and some other places as well.  She has no peripheral edema of the arms or legs.  ASSESSMENT AND PLAN:  So, I think she still disease free.  I do not need to do blood work today.  We will do it in 6 months.  I will see her then and by the time we see her in 6 months she will be in close to finishing her 5 years of Arimidex which I would stop at that juncture.  I think her other medical problems are far more important and any further use of an aromatase inhibitor or tamoxifen.  So, I will stop the drug come December 2013.    ______________________________ Ladona Horns. Mariel Sleet, MD ESN/MEDQ  D:  09/12/2011  T:  09/12/2011  Job:  [id number]

## 2011-09-12 NOTE — Progress Notes (Signed)
This office note has been dictated.

## 2011-09-20 ENCOUNTER — Emergency Department (HOSPITAL_COMMUNITY): Payer: 59

## 2011-09-20 ENCOUNTER — Observation Stay (HOSPITAL_COMMUNITY)
Admission: EM | Admit: 2011-09-20 | Discharge: 2011-09-21 | Disposition: A | Discharge status: Home / Self Care | Payer: 59 | Attending: Internal Medicine | Admitting: Internal Medicine

## 2011-09-20 ENCOUNTER — Encounter (HOSPITAL_COMMUNITY): Payer: Self-pay | Admitting: Emergency Medicine

## 2011-09-20 DIAGNOSIS — G35 Multiple sclerosis: Secondary | ICD-10-CM | POA: Insufficient documentation

## 2011-09-20 DIAGNOSIS — J069 Acute upper respiratory infection, unspecified: Principal | ICD-10-CM | POA: Insufficient documentation

## 2011-09-20 DIAGNOSIS — Z79899 Other long term (current) drug therapy: Secondary | ICD-10-CM | POA: Insufficient documentation

## 2011-09-20 DIAGNOSIS — R29898 Other symptoms and signs involving the musculoskeletal system: Secondary | ICD-10-CM | POA: Insufficient documentation

## 2011-09-20 DIAGNOSIS — E119 Type 2 diabetes mellitus without complications: Secondary | ICD-10-CM

## 2011-09-20 DIAGNOSIS — Z853 Personal history of malignant neoplasm of breast: Secondary | ICD-10-CM | POA: Insufficient documentation

## 2011-09-20 DIAGNOSIS — R5381 Other malaise: Secondary | ICD-10-CM

## 2011-09-20 DIAGNOSIS — W19XXXA Unspecified fall, initial encounter: Secondary | ICD-10-CM | POA: Insufficient documentation

## 2011-09-20 DIAGNOSIS — R5383 Other fatigue: Secondary | ICD-10-CM

## 2011-09-20 DIAGNOSIS — E039 Hypothyroidism, unspecified: Secondary | ICD-10-CM | POA: Insufficient documentation

## 2011-09-20 LAB — URINALYSIS, ROUTINE W REFLEX MICROSCOPIC
Bilirubin Urine: NEGATIVE
Ketones, ur: NEGATIVE mg/dL
Nitrite: NEGATIVE
Specific Gravity, Urine: 1.021 (ref 1.005–1.030)
Urobilinogen, UA: 0.2 mg/dL (ref 0.0–1.0)

## 2011-09-20 LAB — COMPREHENSIVE METABOLIC PANEL
ALT: 16 U/L (ref 0–35)
Alkaline Phosphatase: 70 U/L (ref 39–117)
BUN: 15 mg/dL (ref 6–23)
CO2: 24 mEq/L (ref 19–32)
Calcium: 9.4 mg/dL (ref 8.4–10.5)
GFR calc Af Amer: 85 mL/min — ABNORMAL LOW (ref >90)
GFR calc non Af Amer: 73 mL/min — ABNORMAL LOW (ref >90)
Glucose, Bld: 106 mg/dL — ABNORMAL HIGH (ref 70–99)
Sodium: 137 mEq/L (ref 135–145)

## 2011-09-20 LAB — CBC
HCT: 43.8 % (ref 36.0–46.0)
Hemoglobin: 15.5 g/dL — ABNORMAL HIGH (ref 12.0–15.0)
MCV: 83.6 fL (ref 78.0–100.0)
Platelets: 244 10*3/uL (ref 150–400)
RBC: 5.24 MIL/uL — ABNORMAL HIGH (ref 3.87–5.11)
WBC: 10.9 10*3/uL — ABNORMAL HIGH (ref 4.0–10.5)

## 2011-09-20 LAB — DIFFERENTIAL
Eosinophils Relative: 0 % (ref 0–5)
Lymphocytes Relative: 5 % — ABNORMAL LOW (ref 12–46)
Lymphs Abs: 0.5 10*3/uL — ABNORMAL LOW (ref 0.7–4.0)
Monocytes Relative: 12 % (ref 3–12)

## 2011-09-20 MED ORDER — SODIUM CHLORIDE 0.9 % IV SOLN
1000.0000 mL | INTRAVENOUS | Status: DC
  Administered 2011-09-20: 1000 mL via INTRAVENOUS

## 2011-09-20 MED ORDER — ACETAMINOPHEN 325 MG PO TABS
650.0000 mg | ORAL_TABLET | Freq: Once | ORAL | Status: AC
  Administered 2011-09-20: 650 mg via ORAL
  Filled 2011-09-20: qty 2

## 2011-09-20 NOTE — ED Notes (Signed)
Bed:WA03<BR> Expected date:<BR> Expected time:<BR> Means of arrival:<BR> Comments:<BR> ems

## 2011-09-20 NOTE — H&P (Signed)
PCP:   Ginette Otto, MD, MD   Confirmed Harlen Labs, MD -- neurologist at [facility] Dr. Mariel Sleet, oncology   Chief Complaint:  Fall  HPI: [phone] with h/o breast ca s/p mastectomies, progressive MS, and  hypothyroidism presents with URI symtpoms, cough, increased weakness and  a fall.   Pt is reliable historian, states she has progressive MS but this is  overall stable, possibly a bit progressive but subtly over a long period  of time. Her baseline, which she was at before Monday, includes  ambulation with a walker and using a motorized scooter at work, which  she still goes to daily. However, on Monday she started developing cold  symtpoms, with a sore throat, minimally productive cough, sneezing. She  denies any frank fevers, chills, sweats, but does feel weak and  malaised. Wednesday she was getting out of the bathroom and felt so weak  she went down on the bathroom floor and couldnt' get up so called EMS.  Per ED notes, she didn't even want to come to ED, but EMS noted that she  couldn't stand up or get around at all on her walker and so brought her  in.   In the ED vitals with temp 100, HR max 113, otherwise stable. Chem,  LFT's normal. WBC 10.9. UA overall normal appearing, minimal hematuria,  minimal sign of infection. CXR with low lung volumes and bibasilar  atelectasis. Pt given tyelnol and IVF's. She was unable to ambulate in the ED, and both pt and husband expressed concerns about her weakness and safety at home, so observation was requested.   ROS as above otherwise negative. No dysuria, no GI symptoms, no dyspnea,  chest pain, palpitations, headache. She has baseline left greater than  right upper and lower extremity weakness but this has been stable. She  follows very well with Dr. Renne Crigler for MS. Otherwise negative.    Past Medical History  Diagnosis Date  . 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 Obesity   . Multiple  sclerosis   . Hypertension   . Goiter     On synthroid     Past Surgical History  Procedure Date  . Mastectomy, radical 10/08    right  . Cervical conization w/bx 1973  . Knee arthroscopy w/ meniscal repair 2002  . Bone spur     removed from rt large toe  . Cataract extraction, bilateral     Medications:  HOME MEDS: Reconciled with the patient, she knows her meds well.  Prior to Admission medications   Medication Sig Start Date End Date Taking? Authorizing Provider  anastrozole (ARIMIDEX) 1 MG tablet Take 1 mg by mouth daily.     Yes Historical Provider, MD  aspirin 81 MG tablet Take 81 mg by mouth daily.     Yes Historical Provider, MD  B Complex-C-Folic Acid (MULTIVITAMIN, STRESS FORMULA) tablet Take 1 tablet by mouth daily.     Yes Historical Provider, MD  calcium-vitamin D (OSCAL WITH D) 500-200 MG-UNIT per tablet Take 2 tablets by mouth daily.     Yes Historical Provider, MD  cholecalciferol (VITAMIN D) 1000 UNITS tablet Take 1,000 Units by mouth daily.     Yes Historical Provider, MD  cyanocobalamin 1000 MCG tablet Take 100 mcg by mouth daily.     Yes Historical Provider, MD  hydrochlorothiazide (HYDRODIURIL) 25 MG tablet Take 25 mg by mouth daily.     Yes Historical Provider, MD  levothyroxine (  SYNTHROID, LEVOTHROID) 100 MCG tablet Take 100 mcg by mouth daily.     Yes Historical Provider, MD  losartan (COZAAR) 50 MG tablet Take 50 mg by mouth daily.     Yes Historical Provider, MD    Allergies:  No Known Allergies  Social History:   reports that she has quit smoking. She has never used smokeless tobacco. She reports that she drinks about 8.4 ounces of alcohol per week. She reports that she does not use illicit drugs. She lives at home with her husband and has a daughter. Her neurologist is an old family friend that she has known since she was a child. She is able to get around with a walker and uses a motorized scooter when she goes to work, which she still goes  to  Family History: Family History  Problem Relation Age of Onset  . Heart disease Mother   . Cancer Father     prostate cancer  . Heart disease Sister   . Cancer Brother     prostate cancer    Physical Exam: Filed Vitals:   09/20/11 1840 09/20/11 2014  BP: 155/91 130/66  Pulse: 113 102  Temp: 100 F (37.8 C) 100 F (37.8 C)  TempSrc: Oral Oral  Resp: 24 22  SpO2: 98% 97%   Blood pressure 130/66, pulse 102, temperature 100 F (37.8 C), temperature source Oral, resp. rate 22, SpO2 97.00%.  Gen: Obese, younger than stated age appearing F in ED stretcher,  pleasant, well appearing, not ill. Has occasionaly episodes of coughing  fits and her face gets bright red, it sounds dry and non productive to  me.  HEENT: Pupils round and reactive, equal, EOMI, no nystagmus, mouth moist  and normal, good dentition Lungs: Anteriorly is CTAB no w/c/r, good air movement, no increased WOB,  no accessory muscles, normal exam Heart: Regular S1/2 appreciated, without m/g, no heaves, not tachy,  normal exam Abd: Obese but soft, not tender, not distended, no grimacing, benign  exam Extrem: Warm, perfusing well, radials palpated, normal exam, no BLE  edema. LLE shin has a fresh bruise.  Neuro: Alert, attentive, conversant, appropriate. Seen to move  extremities spontaneously, however RUE and RLE are subtly stronger than  the left, however LUE still can shrug shoulder, flex/extend all joints.  LLE is noticeably harder to lift of stretcher than the right.    Labs & Imaging Results for orders placed during the hospital encounter of 09/20/11 (from the past 48 hour(s))  GLUCOSE, CAPILLARY     Status: Normal   Collection Time   09/20/11  7:46 PM      Component Value Range Comment   Glucose-Capillary 99  70 - 99 (mg/dL)   CBC     Status: Abnormal   Collection Time   09/20/11  7:52 PM      Component Value Range Comment   WBC 10.9 (*) 4.0 - 10.5 (K/uL)    RBC 5.24 (*) 3.87 - 5.11 (MIL/uL)     Hemoglobin 15.5 (*) 12.0 - 15.0 (g/dL)    HCT 16.1  09.6 - 04.5 (%)    MCV 83.6  78.0 - 100.0 (fL)    MCH 29.6  26.0 - 34.0 (pg)    MCHC 35.4  30.0 - 36.0 (g/dL)    RDW 40.9  81.1 - 91.4 (%)    Platelets 244  150 - 400 (K/uL)   DIFFERENTIAL     Status: Abnormal   Collection Time   09/20/11  7:52  PM      Component Value Range Comment   Neutrophils Relative 84 (*) 43 - 77 (%)    Neutro Abs 9.1 (*) 1.7 - 7.7 (K/uL)    Lymphocytes Relative 5 (*) 12 - 46 (%)    Lymphs Abs 0.5 (*) 0.7 - 4.0 (K/uL)    Monocytes Relative 12  3 - 12 (%)    Monocytes Absolute 1.3 (*) 0.1 - 1.0 (K/uL)    Eosinophils Relative 0  0 - 5 (%)    Eosinophils Absolute 0.0  0.0 - 0.7 (K/uL)    Basophils Relative 0  0 - 1 (%)    Basophils Absolute 0.0  0.0 - 0.1 (K/uL)   COMPREHENSIVE METABOLIC PANEL     Status: Abnormal   Collection Time   09/20/11  7:52 PM      Component Value Range Comment   Sodium 137  135 - 145 (mEq/L)    Potassium 3.8  3.5 - 5.1 (mEq/L)    Chloride 102  96 - 112 (mEq/L)    CO2 24  19 - 32 (mEq/L)    Glucose, Bld 106 (*) 70 - 99 (mg/dL)    BUN 15  6 - 23 (mg/dL)    Creatinine, Ser 1.61  0.50 - 1.10 (mg/dL)    Calcium 9.4  8.4 - 10.5 (mg/dL)    Total Protein 6.9  6.0 - 8.3 (g/dL)    Albumin 3.7  3.5 - 5.2 (g/dL)    AST 22  0 - 37 (U/L) NO VISIBLE HEMOLYSIS   ALT 16  0 - 35 (U/L)    Alkaline Phosphatase 70  39 - 117 (U/L)    Total Bilirubin 0.4  0.3 - 1.2 (mg/dL)    GFR calc non Af Amer 73 (*) >90 (mL/min)    GFR calc Af Amer 85 (*) >90 (mL/min)   URINALYSIS, ROUTINE W REFLEX MICROSCOPIC     Status: Abnormal   Collection Time   09/20/11  8:27 PM      Component Value Range Comment   Color, Urine YELLOW  YELLOW     APPearance CLEAR  CLEAR     Specific Gravity, Urine 1.021  1.005 - 1.030     pH 7.0  5.0 - 8.0     Glucose, UA NEGATIVE  NEGATIVE (mg/dL)    Hgb urine dipstick MODERATE (*) NEGATIVE     Bilirubin Urine NEGATIVE  NEGATIVE     Ketones, ur NEGATIVE  NEGATIVE (mg/dL)    Protein,  ur NEGATIVE  NEGATIVE (mg/dL)    Urobilinogen, UA 0.2  0.0 - 1.0 (mg/dL)    Nitrite NEGATIVE  NEGATIVE     Leukocytes, UA TRACE (*) NEGATIVE    URINE MICROSCOPIC-ADD ON     Status: Abnormal   Collection Time   09/20/11  8:27 PM      Component Value Range Comment   WBC, UA 3-6  <3 (WBC/hpf)    RBC / HPF 7-10  <3 (RBC/hpf)    Bacteria, UA FEW (*) RARE     Dg Chest 1 View  09/20/2011  *RADIOLOGY REPORT*  Clinical Data: Cough.  CHEST - 1 VIEW  Comparison: None.  Findings: Trachea is midline.  Heart size normal.  Lungs are somewhat low in volume with minimal bibasilar atelectasis.  No pleural fluid.  IMPRESSION: Low lung volumes with minimal bibasilar atelectasis.  Original Report Authenticated By: Reyes Ivan, M.D.    Impression Present on Admission:  .URI (upper respiratory infection) .Leg weakness,  bilateral .Multiple sclerosis  65yoF with h/o breast ca s/p mastectomies, progressive MS, and  hypothyroidism presents with URI symtpoms, cough, increased weakness and  a fall.   1. URI, lower extremity weakness: Suspect she has a benign viral URI  with some associated weakness and malaise, but in the setting of MS and  baseline LE weakness, this has probably tipped her over and caused her  fall. She was stable before the URI symptoms, so I don't think  progressive MS is the diagnosis. She has lowgrade temp and minimal WBC  count, negative CXR, so I don't think this needs treatment other than  supportive. Will admit observation for inability to walk and generalized  weakness, get her mobilized and consult PT. If after a day or two she still cannot mobilize, consider Neuro consult.   2. H/o breast ca: continue arimidex 3. Hypothyroid: continue replacement 4. HTN: Continue ASA, losartan, HCTZ  DVT prophy lovenox Regular bed, obs status, [facility] team 3 Presumed full code   Other plans as per orders.   Jermarion Poffenberger 09/20/2011, 11:11 PM

## 2011-09-20 NOTE — ED Notes (Signed)
Pt w/ progressive MS, developed cough and cold last p.m. Has noticeable increased weakness in bilateral lower extremities, fell while trying to use walker at home today. This is normally not a problem. Called EMS to help her get off the floor, wanted to stay at home but was unable to demonstrate she could ambulate safely at home w walker. EMS states was sitting on buttock upon their arrival. #20 left forearm w/ 300 ml bolus of fluid. P-110, BP- 136/88, R- 20, O2 SAT 97%, CBG - 156. Pt is alert, denies pain anywhere, did not strike head, denies loss of consciousness.

## 2011-09-20 NOTE — ED Provider Notes (Signed)
History     CSN: [id number] Arrival date & time 09/20/11  Rickey Primus First MD Initiated Contact with Patient 09/20/11 1923    PCP Stoneking Chief Complaint  Patient presents with  . Fall    HPI Pt has history of progressive MS. Usually she is weaker at the end of the day but not this weak.  This is very severe. This afternoon she could not get out of bed.  Pt has had a lot of difficulty today walking or doing any daily activities.  She fell and had to call EMS to get up.  Pt was using her walker when her legs gave out.  Pt developed a scratchy throat yesterday.   She has been coughing up phlegm.  She has a low grade temp.  Pt did not injure herself in the fall.  Past Medical History  Diagnosis Date  . Breast cancer 10/08    DCIS  . Obesity   . Multiple sclerosis   . Hypertension   . Goiter     Past Surgical History  Procedure Date  . Mastectomy, radical 10/08    right  . Cervical conization w/bx   . Knee arthroscopy w/ meniscal repair   . Bone spur     removed from rt large toe  . Cataract extraction, bilateral     Family History  Problem Relation Age of Onset  . Heart disease Mother   . Cancer Father     prostate cancer  . Heart disease Sister   . Cancer Brother     prostate cancer    History  Substance Use Topics  . Smoking status: Former Games developer  . Smokeless tobacco: Never Used  . Alcohol Use: 8.4 oz/week    7 Glasses of wine, 7 Shots of liquor per week    OB History    Grav Para Term Preterm Abortions TAB SAB Ect Mult Living                  Review of Systems  Constitutional: Negative for fever.  Gastrointestinal: Negative for vomiting and diarrhea.  Genitourinary: Negative for dysuria.  Neurological: Negative for headaches.  Psychiatric/Behavioral: Negative for confusion.  All other systems reviewed and are negative.    Allergies  Review of patient's allergies indicates no known allergies.  Home Medications   Current Outpatient Rx  Name Route  Sig Dispense Refill  . ANASTROZOLE 1 MG PO TABS Oral Take 1 mg by mouth daily.      . ASPIRIN 81 MG PO TABS Oral Take 81 mg by mouth daily.      Marland Kitchen CEFOL PO TABS Oral Take 1 tablet by mouth daily.      Marland Kitchen CALCIUM CARBONATE-VITAMIN D 500-200 MG-UNIT PO TABS Oral Take 2 tablets by mouth daily.      Marland Kitchen VITAMIN D 1000 UNITS PO TABS Oral Take 1,000 Units by mouth daily.      . CYANOCOBALAMIN 1000 MCG PO TABS Oral Take 100 mcg by mouth daily.      Marland Kitchen HYDROCHLOROTHIAZIDE 25 MG PO TABS Oral Take 25 mg by mouth daily.      Marland Kitchen LEVOTHYROXINE SODIUM 100 MCG PO TABS Oral Take 100 mcg by mouth daily.      Marland Kitchen LOSARTAN POTASSIUM 50 MG PO TABS Oral Take 50 mg by mouth daily.        BP 155/91  Pulse 113  Temp(Src) 100 F (37.8 C) (Oral)  Resp 24  SpO2 98%  Physical Exam  Nursing  note and vitals reviewed. Constitutional: She appears well-developed and well-nourished. No distress.  HENT:  Head: Normocephalic and atraumatic.  Right Ear: External ear normal.  Left Ear: External ear normal.  Eyes: Conjunctivae are normal. Right eye exhibits no discharge. Left eye exhibits no discharge. No scleral icterus.  Neck: Neck supple. No tracheal deviation present.  Cardiovascular: Normal rate, regular rhythm and intact distal pulses.   Pulmonary/Chest: Effort normal and breath sounds normal. No stridor. No respiratory distress. She has no wheezes. She has no rales.       Frequent coughing  Abdominal: Soft. Bowel sounds are normal. She exhibits no distension. There is no tenderness. There is no rebound and no guarding.  Musculoskeletal: She exhibits no edema and no tenderness.  Neurological: She is alert. She has normal strength. She is not disoriented. She displays no tremor. No sensory deficit. Cranial nerve deficit:  no gross defecits noted. She exhibits normal muscle tone. She displays no seizure activity.       General weakness, left greater than right , unable to lift legs of bed or sit up, sensation normal  Skin:  Skin is warm and dry. No rash noted.  Psychiatric: She has a normal mood and affect.    ED Course  Procedures (including critical care time)  Labs Reviewed  CBC - Abnormal; Notable for the following:    WBC 10.9 (*)    RBC 5.24 (*)    Hemoglobin 15.5 (*)    All other components within normal limits  DIFFERENTIAL - Abnormal; Notable for the following:    Neutrophils Relative 84 (*)    Neutro Abs 9.1 (*)    Lymphocytes Relative 5 (*)    Lymphs Abs 0.5 (*)    Monocytes Absolute 1.3 (*)    All other components within normal limits  COMPREHENSIVE METABOLIC PANEL - Abnormal; Notable for the following:    Glucose, Bld 106 (*)    GFR calc non Af Amer 73 (*)    GFR calc Af Amer 85 (*)    All other components within normal limits  URINALYSIS, ROUTINE W REFLEX MICROSCOPIC - Abnormal; Notable for the following:    Hgb urine dipstick MODERATE (*)    Leukocytes, UA TRACE (*)    All other components within normal limits  URINE MICROSCOPIC-ADD ON - Abnormal; Notable for the following:    Bacteria, UA FEW (*)    All other components within normal limits  GLUCOSE, CAPILLARY   Dg Chest 1 View  09/20/2011  *RADIOLOGY REPORT*  Clinical Data: Cough.  CHEST - 1 VIEW  Comparison: None.  Findings: Trachea is midline.  Heart size normal.  Lungs are somewhat low in volume with minimal bibasilar atelectasis.  No pleural fluid.  IMPRESSION: Low lung volumes with minimal bibasilar atelectasis.  Original Report Authenticated By: Reyes Ivan, M.D.    1. MS (multiple sclerosis)   2. URI, acute     MDM  I suspect pt is having an exacerbation of her MS.  No sign of pna, or definite uti.  Pt is unable to ambulate .  Will admit for observation.  Will consult neurology for their recommendations        Celene Kras, MD 09/20/11 2212

## 2011-09-21 ENCOUNTER — Encounter (HOSPITAL_COMMUNITY): Payer: Self-pay | Admitting: *Deleted

## 2011-09-21 DIAGNOSIS — G35 Multiple sclerosis: Secondary | ICD-10-CM

## 2011-09-21 DIAGNOSIS — R5383 Other fatigue: Secondary | ICD-10-CM

## 2011-09-21 DIAGNOSIS — J069 Acute upper respiratory infection, unspecified: Secondary | ICD-10-CM

## 2011-09-21 DIAGNOSIS — E119 Type 2 diabetes mellitus without complications: Secondary | ICD-10-CM

## 2011-09-21 DIAGNOSIS — R5381 Other malaise: Secondary | ICD-10-CM

## 2011-09-21 LAB — CBC
HCT: 39.1 % (ref 36.0–46.0)
MCH: 28.9 pg (ref 26.0–34.0)
MCHC: 34.8 g/dL (ref 30.0–36.0)
MCV: 83.2 fL (ref 78.0–100.0)
RDW: 12.7 % (ref 11.5–15.5)

## 2011-09-21 LAB — BASIC METABOLIC PANEL
BUN: 12 mg/dL (ref 6–23)
CO2: 26 mEq/L (ref 19–32)
Calcium: 9.2 mg/dL (ref 8.4–10.5)
Chloride: 103 mEq/L (ref 96–112)
Creatinine, Ser: 0.83 mg/dL (ref 0.50–1.10)

## 2011-09-21 MED ORDER — SODIUM CHLORIDE 0.9 % IV SOLN: 250.0000 mL | INTRAVENOUS | Status: DC | PRN

## 2011-09-21 MED ORDER — ANASTROZOLE 1 MG PO TABS
1.0000 mg | ORAL_TABLET | Freq: Every day | ORAL | Status: DC
  Administered 2011-09-21: 1 mg via ORAL
  Filled 2011-09-21: qty 1

## 2011-09-21 MED ORDER — SODIUM CHLORIDE 0.9 % IJ SOLN
3.0000 mL | Freq: Two times a day (BID) | INTRAMUSCULAR | Status: DC
  Administered 2011-09-21 (×2): 3 mL via INTRAVENOUS

## 2011-09-21 MED ORDER — LOSARTAN POTASSIUM 50 MG PO TABS
50.0000 mg | ORAL_TABLET | Freq: Every day | ORAL | Status: DC
  Administered 2011-09-21: 50 mg via ORAL
  Filled 2011-09-21: qty 1

## 2011-09-21 MED ORDER — ADULT MULTIVITAMIN W/MINERALS CH
1.0000 | ORAL_TABLET | Freq: Every day | ORAL | Status: DC
  Administered 2011-09-21: 1 via ORAL
  Filled 2011-09-21: qty 1

## 2011-09-21 MED ORDER — SODIUM CHLORIDE 0.9 % IJ SOLN: 3.0000 mL | INTRAMUSCULAR | Status: DC | PRN

## 2011-09-21 MED ORDER — LEVOTHYROXINE SODIUM 100 MCG PO TABS
100.0000 ug | ORAL_TABLET | Freq: Every day | ORAL | Status: DC
Start: 2011-09-21 — End: 2011-09-21
  Administered 2011-09-21: 100 ug via ORAL
  Filled 2011-09-21: qty 1

## 2011-09-21 MED ORDER — ENOXAPARIN SODIUM 40 MG/0.4ML ~~LOC~~ SOLN
40.0000 mg | SUBCUTANEOUS | Status: DC
  Filled 2011-09-21: qty 0.4

## 2011-09-21 MED ORDER — ACETAMINOPHEN 325 MG PO TABS
650.0000 mg | ORAL_TABLET | Freq: Four times a day (QID) | ORAL | Status: DC | PRN
  Administered 2011-09-21: 650 mg via ORAL
  Filled 2011-09-21: qty 2

## 2011-09-21 MED ORDER — ACETAMINOPHEN 650 MG RE SUPP: 650.0000 mg | Freq: Four times a day (QID) | RECTAL | Status: DC | PRN

## 2011-09-21 MED ORDER — HYDROCHLOROTHIAZIDE 25 MG PO TABS
25.0000 mg | ORAL_TABLET | Freq: Every day | ORAL | Status: DC
  Administered 2011-09-21: 25 mg via ORAL
  Filled 2011-09-21: qty 1

## 2011-09-21 MED ORDER — GUAIFENESIN-DM 100-10 MG/5ML PO SYRP: 5.0000 mL | ORAL_SOLUTION | ORAL | Status: DC | PRN

## 2011-09-21 MED ORDER — VITAMIN B-12 100 MCG PO TABS
100.0000 ug | ORAL_TABLET | Freq: Every day | ORAL | Status: DC
  Administered 2011-09-21: 100 ug via ORAL
  Filled 2011-09-21: qty 1

## 2011-09-21 MED ORDER — ONDANSETRON HCL 4 MG PO TABS: 4.0000 mg | ORAL_TABLET | Freq: Four times a day (QID) | ORAL | Status: DC | PRN

## 2011-09-21 MED ORDER — VITAMIN D3 25 MCG (1000 UNIT) PO TABS
1000.0000 [IU] | ORAL_TABLET | Freq: Every day | ORAL | Status: DC
  Administered 2011-09-21: 1000 [IU] via ORAL
  Filled 2011-09-21: qty 1

## 2011-09-21 MED ORDER — SENNA 8.6 MG PO TABS: 1.0000 | ORAL_TABLET | Freq: Two times a day (BID) | ORAL | Status: DC

## 2011-09-21 MED ORDER — CALCIUM CARBONATE-VITAMIN D 500-200 MG-UNIT PO TABS
2.0000 | ORAL_TABLET | Freq: Every day | ORAL | Status: DC
Start: 2011-09-21 — End: 2011-09-21
  Administered 2011-09-21: 2 via ORAL
  Filled 2011-09-21: qty 2

## 2011-09-21 MED ORDER — ONDANSETRON HCL 4 MG/2ML IJ SOLN: 4.0000 mg | Freq: Four times a day (QID) | INTRAMUSCULAR | Status: DC | PRN

## 2011-09-21 MED ORDER — ALBUTEROL SULFATE (5 MG/ML) 0.5% IN NEBU: 2.5000 mg | INHALATION_SOLUTION | Freq: Four times a day (QID) | RESPIRATORY_TRACT | Status: DC | PRN

## 2011-09-21 MED ORDER — DOCUSATE SODIUM 100 MG PO CAPS
100.0000 mg | ORAL_CAPSULE | Freq: Two times a day (BID) | ORAL | Status: DC
  Filled 2011-09-21 (×2): qty 1

## 2011-09-21 MED ORDER — ASPIRIN EC 81 MG PO TBEC
81.0000 mg | DELAYED_RELEASE_TABLET | Freq: Every day | ORAL | Status: DC
  Administered 2011-09-21: 81 mg via ORAL
  Filled 2011-09-21: qty 1

## 2011-09-21 NOTE — Evaluation (Signed)
Physical Therapy Evaluation Patient Details Name: Colleen [name] MRN: [id number] DOB: [birth year] Today's Date: 09/21/2011 Time: 1000-     PT Assessment / Plan / Recommendation Clinical Impression  pt adm with URI, has MS and amb with RW at baseline, will benefit from PT to maximize independence for home setting    PT Assessment  Patient needs continued PT services    Follow Up Recommendations  No PT follow up    Barriers to Discharge None      lEquipment Recommendations  None recommended by PT    Recommendations for Other Services     Frequency Min 3X/week    Precautions / Restrictions Precautions Precautions: Fall Restrictions Weight Bearing Restrictions: No   Pertinent Vitals/Pain       Mobility  Bed Mobility Bed Mobility: Supine to Sit Supine to Sit: 5: Supervision;HOB elevated;4: Min guard Details for Bed Mobility Assistance: min with LEs, initiation Transfers Transfers: Sit to Stand;Stand to Sit Sit to Stand: 4: Min guard;From bed Stand to Sit: 5: Supervision;To chair/3-in-1;Without upper extremity assist Details for Transfer Assistance: cue for hand placement Ambulation/Gait Ambulation/Gait Assistance: 4: Min assist Ambulation Distance (Feet): 60 Feet Assistive device: Rolling walker Ambulation/Gait Assistance Details: cues for shoulder depression, trunk extension Gait Pattern: Step-to pattern;Step-through pattern;Trunk flexed    Exercises     PT Diagnosis: Difficulty walking  PT Problem List: Decreased strength;Decreased range of motion;Decreased activity tolerance;Decreased balance;Decreased mobility;Decreased knowledge of use of DME PT Treatment Interventions: DME instruction;Gait training;Stair training;Functional mobility training;Therapeutic activities;Therapeutic exercise;Balance training;Patient/family education   PT Goals Acute Rehab PT Goals PT Goal Formulation: With patient Time For Goal Achievement: 09/21/11 Potential to Achieve Goals:  Good Pt will go Supine/Side to Sit: Independently PT Goal: Supine/Side to Sit - Progress: Goal set today Pt will go Sit to Supine/Side: Independently PT Goal: Sit to Supine/Side - Progress: Goal set today Pt will go Stand to Sit: with modified independence PT Goal: Stand to Sit - Progress: Goal set today Pt will Ambulate: 51 - 150 feet;with modified independence;with rolling walker PT Goal: Ambulate - Progress: Goal set today Pt will Go Up / Down Stairs: 3-5 stairs;with min assist PT Goal: Up/Down Stairs - Progress: Goal set today Pt will Perform Home Exercise Program: with supervision, verbal cues required/provided PT Goal: Perform Home Exercise Program - Progress: Goal set today  Visit Information  Last PT Received On: 09/21/11 Assistance Needed: +1    Subjective Data  Subjective: it is getting crowded in here Patient Stated Goal: home, return to PLOF   Prior Functioning  Home Living Lives With: Spouse Available Help at Discharge: Family Type of Home: House Home Access: Stairs to enter Secretary/administrator of Steps: 2-3 Entrance Stairs-Rails:  (rail or uses door jamb) Home Layout: Two level Alternate Level Stairs-Number of Steps: pt resides on main level Firefighter: Standard Home Adaptive Equipment: Environmental consultant - four wheeled;Bedside commode/3-in-1;Wheelchair - powered Additional Comments: 3in1 in attic, transport chair, uses power chair/scooter at work Prior Function Level of Independence: Independent Able to Take Stairs?: Yes (with assist) Driving: Yes Vocation: Full time employment Communication Communication: No difficulties    Cognition  Overall Cognitive Status: Appears within functional limits for tasks assessed/performed Arousal/Alertness: Awake/alert Orientation Level: Appears intact for tasks assessed Behavior During Session: [facility] for tasks performed    Extremity/Trunk Assessment Right Upper Extremity Assessment RUE ROM/Strength/Tone: [facility] for tasks  assessed Left Upper Extremity Assessment LUE ROM/Strength/Tone: [facility] for tasks assessed Right Lower Extremity Assessment RLE ROM/Strength/Tone: Deficits;Due to pain RLE ROM/Strength/Tone Deficits:  pt reports knee sore with movement RLE Sensation:  (WFL per pt report) Left Lower Extremity Assessment LLE ROM/Strength/Tone: WFL for tasks assessed LLE Sensation:  (WFL per pt report)   Balance    End of Session PT - End of Session Equipment Utilized During Treatment: Gait belt Activity Tolerance: Patient tolerated treatment well Nurse Communication: Mobility status   [facility] 09/21/2011, 11:48 AM

## 2011-09-21 NOTE — Discharge Summary (Addendum)
Physician Discharge Summary  Colleen [name] ZOX:[id number] DOB: 1946/01/26 DOA: 09/20/2011  PCP: Ginette Otto, MD, MD  Admit date: 09/20/2011 Discharge date: 09/21/2011  Discharge Diagnoses:  Principal Problem:  *URI (upper respiratory infection) Active Problems:  Multiple sclerosis  Leg weakness, bilateral   Discharge Condition: Stable  Disposition: Home  History of present illness:  From original hpi: Pt is reliable historian, states she has progressive MS but this is  overall stable, possibly a bit progressive but subtly over a long period  of time. Her baseline, which she was at before Monday, includes  ambulation with a walker and using a motorized scooter at work, which  she still goes to daily. However, on Monday she started developing cold  symtpoms, with a sore throat, minimally productive cough, sneezing. She  denies any frank fevers, chills, sweats, but does feel weak and  malaised. Wednesday she was getting out of the bathroom and felt so weak  she went down on the bathroom floor and couldnt' get up so called EMS.  Per ED notes, she didn't even want to come to ED, but EMS noted that she  couldn't stand up or get around at all on her walker and so brought her  in.    Hospital Course:  While in house repeat cbc showed that patient had normal wbc and she was afebrile.  Was evaluated by physical therapy given her reported weakness but after evaluation it was noted that patient did not require PT follow-up nor did she require any equipment.    Patient will be discharged to follow up with her primary care physician.  Discharge Exam: Filed Vitals:   09/21/11 0434  BP: 117/73  Pulse: 83  Temp: 98.6 F (37 C)  Resp: 18   Filed Vitals:   09/20/11 2014 09/20/11 2331 09/21/11 0003 09/21/11 0434  BP: 130/66 106/51 93/62 117/73  Pulse: 102 96 89 83  Temp: 100 F (37.8 C) 99.5 F (37.5 C) 99.2 F (37.3 C) 98.6 F (37 C)  TempSrc: Oral Oral Oral Oral  Resp:  22 19 18 18   Height:   5\' 4"  (1.626 m)   Weight:   98.544 kg (217 lb 4 oz)   SpO2: 97% 96% 96% 96%   General: Alert, awake, oriented x3, in no acute distress.  HEENT: + Rhinorrhea, sinus tenderness, erythematous oropharynx, No bruits, no goiter.  Heart: Regular rate and rhythm, without murmurs, rubs, gallops.  Lungs: Clear to auscultation, bilateral air movement.  Abdomen: Soft, nontender, nondistended, positive bowel sounds, no hepatomegaly or splenomegaly.  Neuro: patient answers questions appropriately and moves all extremities   Discharge Instructions  Discharge Orders    Future Appointments: Provider: Department: Dept Phone: Center:   03/15/2012 11:00 AM Randall An, MD [facility] [phone] None     Future Orders Please Complete By Expires   Diet - low sodium heart healthy      Increase activity slowly      Discharge instructions      Comments:   Home with follow up with your primary care physician.   Call MD for:  difficulty breathing, headache or visual disturbances      Call MD for:  redness, tenderness, or signs of infection (pain, swelling, redness, odor or green/yellow discharge around incision site)      Call MD for:  temperature >100.4        Medication List  As of 09/21/2011  2:17 PM   TAKE these medications  anastrozole 1 MG tablet   Commonly known as: ARIMIDEX   Take 1 mg by mouth daily.      aspirin 81 MG tablet   Take 81 mg by mouth daily.      calcium-vitamin D 500-200 MG-UNIT per tablet   Commonly known as: OSCAL WITH D   Take 2 tablets by mouth daily.      cholecalciferol 1000 UNITS tablet   Commonly known as: VITAMIN D   Take 1,000 Units by mouth daily.      cyanocobalamin 1000 MCG tablet   Take 100 mcg by mouth daily.      hydrochlorothiazide 25 MG tablet   Commonly known as: HYDRODIURIL   Take 25 mg by mouth daily.      levothyroxine 100 MCG tablet   Commonly known as: SYNTHROID, LEVOTHROID   Take 100 mcg by mouth  daily.      losartan 50 MG tablet   Commonly known as: COZAAR   Take 50 mg by mouth daily.      multivitamin, stress formula tablet   Take 1 tablet by mouth daily.              The results of significant diagnostics from this hospitalization (including imaging, microbiology, ancillary and laboratory) are listed below for reference.    Significant Diagnostic Studies: Dg Chest 1 View  09/20/2011  *RADIOLOGY REPORT*  Clinical Data: Cough.  CHEST - 1 VIEW  Comparison: None.  Findings: Trachea is midline.  Heart size normal.  Lungs are somewhat low in volume with minimal bibasilar atelectasis.  No pleural fluid.  IMPRESSION: Low lung volumes with minimal bibasilar atelectasis.  Original Report Authenticated By: Reyes Ivan, M.D.    Microbiology: No results found for this or any previous visit (from the past 240 hour(s)).   Labs: Basic Metabolic Panel:  Lab 09/21/11 1610 09/20/11 1952  NA 137 137  K 3.5 3.8  CL 103 102  CO2 26 24  GLUCOSE 108* 106*  BUN 12 15  CREATININE 0.83 0.83  CALCIUM 9.2 9.4  MG -- --  PHOS -- --   Liver Function Tests:  Lab 09/20/11 1952  AST 22  ALT 16  ALKPHOS 70  BILITOT 0.4  PROT 6.9  ALBUMIN 3.7   No results found for this basename: LIPASE:5,AMYLASE:5 in the last 168 hours No results found for this basename: AMMONIA:5 in the last 168 hours CBC:  Lab 09/21/11 0400 09/20/11 1952  WBC 7.0 10.9*  NEUTROABS -- 9.1*  HGB 13.6 15.5*  HCT 39.1 43.8  MCV 83.2 83.6  PLT 246 244   Cardiac Enzymes: No results found for this basename: CKTOTAL:5,CKMB:5,CKMBINDEX:5,TROPONINI:5 in the last 168 hours BNP: No components found with this basename: POCBNP:5 CBG:  Lab 09/20/11 1946  GLUCAP 99    Time coordinating discharge: 25 minutes  Signed:  Penny Pia  [facility] 09/21/2011, 2:17 PM    Addendum:  At this point recommend follow up with her PCP in ~ 1 week and supportive care for her upper respiratory  infection.

## 2011-09-21 NOTE — Progress Notes (Signed)
Pt discharged to home via wheelchair with husband, discharge instructions reviewed with pt who verbalized understanding.

## 2011-09-21 NOTE — Progress Notes (Signed)
Subjective: Pt still reports feeling tired and weak.  Has not seen PT this morning.  Denies any fevers or chills.  But is still complaining of sore throat and sinus congestion.  Objective: Filed Vitals:   09/20/11 2014 09/20/11 2331 09/21/11 0003 09/21/11 0434  BP: 130/66 106/51 93/62 117/73  Pulse: 102 96 89 83  Temp: 100 F (37.8 C) 99.5 F (37.5 C) 99.2 F (37.3 C) 98.6 F (37 C)  TempSrc: Oral Oral Oral Oral  Resp: 22 19 18 18   Height:   5\' 4"  (1.626 m)   Weight:   98.544 kg (217 lb 4 oz)   SpO2: 97% 96% 96% 96%   Weight change:  No intake or output data in the 24 hours ending 09/21/11 0938  General: Alert, awake, oriented x3, in no acute distress.  HEENT: + Rhinorrhea, sinus tenderness, erythematous oropharynx, No bruits, no goiter.  Heart: Regular rate and rhythm, without murmurs, rubs, gallops.  Lungs: Clear to auscultation, bilateral air movement.  Abdomen: Soft, nontender, nondistended, positive bowel sounds, no hepatomegaly or splenomegaly.  Neuro: patient answers questions appropriately and moves all extremities   Lab Results:  Basename 09/21/11 0400 09/20/11 1952  NA 137 137  K 3.5 3.8  CL 103 102  CO2 26 24  GLUCOSE 108* 106*  BUN 12 15  CREATININE 0.83 0.83  CALCIUM 9.2 9.4  MG -- --  PHOS -- --    Basename 09/20/11 1952  AST 22  ALT 16  ALKPHOS 70  BILITOT 0.4  PROT 6.9  ALBUMIN 3.7   No results found for this basename: LIPASE:2,AMYLASE:2 in the last 72 hours  Basename 09/21/11 0400 09/20/11 1952  WBC 7.0 10.9*  NEUTROABS -- 9.1*  HGB 13.6 15.5*  HCT 39.1 43.8  MCV 83.2 83.6  PLT 246 244   No results found for this basename: CKTOTAL:3,CKMB:3,CKMBINDEX:3,TROPONINI:3 in the last 72 hours No components found with this basename: POCBNP:3 No results found for this basename: DDIMER:2 in the last 72 hours No results found for this basename: HGBA1C:2 in the last 72 hours No results found for this basename:  CHOL:2,HDL:2,LDLCALC:2,TRIG:2,CHOLHDL:2,LDLDIRECT:2 in the last 72 hours No results found for this basename: TSH,T4TOTAL,FREET3,T3FREE,THYROIDAB in the last 72 hours No results found for this basename: VITAMINB12:2,FOLATE:2,FERRITIN:2,TIBC:2,IRON:2,RETICCTPCT:2 in the last 72 hours  Micro Results: No results found for this or any previous visit (from the past 240 hour(s)).  Studies/Results: Dg Chest 1 View  09/20/2011  *RADIOLOGY REPORT*  Clinical Data: Cough.  CHEST - 1 VIEW  Comparison: None.  Findings: Trachea is midline.  Heart size normal.  Lungs are somewhat low in volume with minimal bibasilar atelectasis.  No pleural fluid.  IMPRESSION: Low lung volumes with minimal bibasilar atelectasis.  Original Report Authenticated By: Reyes Ivan, M.D.    Medications: I have reviewed the patient's current medications.   Patient Active Hospital Problem List: URI (upper respiratory infection) (09/20/2011) - At this point will continue supportive therapy.  Will plan on following up with physical therapy's evaluation and treatment options.  Multiple sclerosis () Will plan on continuing current regimen.  Leg weakness, bilateral (09/20/2011) Patient has upper respiratory infection.  Will look for signs of improvement but should condition get worse will have to consider other diagnoses like guillan barre or multiple sclerosis flare etc.  Thus will consider Neuro consult should weakness persist or worsen.     LOS: 1 day   Penny Pia M.D.  [facility] 09/21/2011, 9:38 AM

## 2011-09-22 LAB — URINE CULTURE

## 2012-01-25 ENCOUNTER — Other Ambulatory Visit: Payer: Self-pay | Admitting: Internal Medicine

## 2012-01-25 DIAGNOSIS — E041 Nontoxic single thyroid nodule: Secondary | ICD-10-CM

## 2012-02-06 DIAGNOSIS — S8291XA Unspecified fracture of right lower leg, initial encounter for closed fracture: Secondary | ICD-10-CM

## 2012-02-06 HISTORY — DX: Unspecified fracture of right lower leg, initial encounter for closed fracture: S82.91XA

## 2012-02-07 ENCOUNTER — Observation Stay (HOSPITAL_COMMUNITY)
Admission: EM | Admit: 2012-02-07 | Discharge: 2012-02-09 | Disposition: A | Discharge status: Skilled Nursing Facility | Payer: 59 | Attending: Internal Medicine | Admitting: Internal Medicine

## 2012-02-07 ENCOUNTER — Ambulatory Visit
Admission: RE | Admit: 2012-02-07 | Discharge: 2012-02-07 | Disposition: A | Discharge status: Home / Self Care | Payer: 59 | Source: Ambulatory Visit | Attending: Internal Medicine | Admitting: Internal Medicine

## 2012-02-07 ENCOUNTER — Encounter (HOSPITAL_COMMUNITY): Payer: Self-pay | Admitting: Family Medicine

## 2012-02-07 ENCOUNTER — Emergency Department (HOSPITAL_COMMUNITY): Payer: 59

## 2012-02-07 DIAGNOSIS — E669 Obesity, unspecified: Secondary | ICD-10-CM | POA: Insufficient documentation

## 2012-02-07 DIAGNOSIS — W19XXXA Unspecified fall, initial encounter: Secondary | ICD-10-CM | POA: Insufficient documentation

## 2012-02-07 DIAGNOSIS — Z9010 Acquired absence of unspecified breast and nipple: Secondary | ICD-10-CM | POA: Insufficient documentation

## 2012-02-07 DIAGNOSIS — Z23 Encounter for immunization: Secondary | ICD-10-CM | POA: Insufficient documentation

## 2012-02-07 DIAGNOSIS — C50919 Malignant neoplasm of unspecified site of unspecified female breast: Secondary | ICD-10-CM | POA: Diagnosis present

## 2012-02-07 DIAGNOSIS — E041 Nontoxic single thyroid nodule: Secondary | ICD-10-CM

## 2012-02-07 DIAGNOSIS — G35 Multiple sclerosis: Secondary | ICD-10-CM | POA: Insufficient documentation

## 2012-02-07 DIAGNOSIS — S82401A Unspecified fracture of shaft of right fibula, initial encounter for closed fracture: Secondary | ICD-10-CM

## 2012-02-07 DIAGNOSIS — S82899A Other fracture of unspecified lower leg, initial encounter for closed fracture: Principal | ICD-10-CM | POA: Insufficient documentation

## 2012-02-07 DIAGNOSIS — S82409A Unspecified fracture of shaft of unspecified fibula, initial encounter for closed fracture: Secondary | ICD-10-CM

## 2012-02-07 DIAGNOSIS — E039 Hypothyroidism, unspecified: Secondary | ICD-10-CM | POA: Diagnosis present

## 2012-02-07 DIAGNOSIS — I10 Essential (primary) hypertension: Secondary | ICD-10-CM

## 2012-02-07 DIAGNOSIS — Z79899 Other long term (current) drug therapy: Secondary | ICD-10-CM | POA: Insufficient documentation

## 2012-02-07 DIAGNOSIS — Z853 Personal history of malignant neoplasm of breast: Secondary | ICD-10-CM | POA: Insufficient documentation

## 2012-02-07 DIAGNOSIS — R29898 Other symptoms and signs involving the musculoskeletal system: Secondary | ICD-10-CM | POA: Insufficient documentation

## 2012-02-07 LAB — CREATININE, SERUM
GFR calc Af Amer: 84 mL/min — ABNORMAL LOW (ref >90)
GFR calc non Af Amer: 72 mL/min — ABNORMAL LOW (ref >90)

## 2012-02-07 LAB — CBC
HCT: 41.7 % (ref 36.0–46.0)
Hemoglobin: 15.3 g/dL — ABNORMAL HIGH (ref 12.0–15.0)
WBC: 12.9 10*3/uL — ABNORMAL HIGH (ref 4.0–10.5)

## 2012-02-07 LAB — POCT I-STAT, CHEM 8
BUN: 19 mg/dL (ref 6–23)
Calcium, Ion: 1.16 mmol/L (ref 1.13–1.30)
Chloride: 104 mEq/L (ref 96–112)

## 2012-02-07 MED ORDER — SODIUM CHLORIDE 0.9 % IV SOLN: 250.0000 mL | INTRAVENOUS | Status: DC | PRN

## 2012-02-07 MED ORDER — LEVOTHYROXINE SODIUM 100 MCG PO TABS
100.0000 ug | ORAL_TABLET | Freq: Every day | ORAL | Status: DC
  Administered 2012-02-08 – 2012-02-09 (×2): 100 ug via ORAL
  Filled 2012-02-07 (×4): qty 1

## 2012-02-07 MED ORDER — MORPHINE SULFATE 2 MG/ML IJ SOLN: 2.0000 mg | INTRAMUSCULAR | Status: DC | PRN

## 2012-02-07 MED ORDER — LOSARTAN POTASSIUM 50 MG PO TABS
50.0000 mg | ORAL_TABLET | Freq: Every day | ORAL | Status: DC
Start: 2012-02-08 — End: 2012-02-09
  Administered 2012-02-08 – 2012-02-09 (×2): 50 mg via ORAL
  Filled 2012-02-07 (×2): qty 1

## 2012-02-07 MED ORDER — HYDROCHLOROTHIAZIDE 25 MG PO TABS
25.0000 mg | ORAL_TABLET | Freq: Every day | ORAL | Status: DC
  Administered 2012-02-08 – 2012-02-09 (×2): 25 mg via ORAL
  Filled 2012-02-07 (×2): qty 1

## 2012-02-07 MED ORDER — ASPIRIN 81 MG PO CHEW
81.0000 mg | CHEWABLE_TABLET | Freq: Every day | ORAL | Status: DC
  Administered 2012-02-08 – 2012-02-09 (×2): 81 mg via ORAL
  Filled 2012-02-07 (×2): qty 1

## 2012-02-07 MED ORDER — VITAMIN D3 25 MCG (1000 UNIT) PO TABS
1000.0000 [IU] | ORAL_TABLET | Freq: Every day | ORAL | Status: DC
  Administered 2012-02-08 – 2012-02-09 (×2): 1000 [IU] via ORAL
  Filled 2012-02-07 (×2): qty 1

## 2012-02-07 MED ORDER — VITAMIN B-12 100 MCG PO TABS: 100.0000 ug | ORAL_TABLET | Freq: Every day | ORAL | Status: DC

## 2012-02-07 MED ORDER — SODIUM CHLORIDE 0.9 % IJ SOLN
3.0000 mL | Freq: Two times a day (BID) | INTRAMUSCULAR | Status: DC
  Administered 2012-02-08 (×2): 3 mL via INTRAVENOUS

## 2012-02-07 MED ORDER — ANASTROZOLE 1 MG PO TABS
1.0000 mg | ORAL_TABLET | Freq: Every day | ORAL | Status: DC
  Administered 2012-02-08 – 2012-02-09 (×2): 1 mg via ORAL
  Filled 2012-02-07 (×3): qty 1

## 2012-02-07 MED ORDER — ACETAMINOPHEN 325 MG PO TABS
650.0000 mg | ORAL_TABLET | Freq: Four times a day (QID) | ORAL | Status: DC | PRN
  Administered 2012-02-07: 650 mg via ORAL
  Filled 2012-02-07: qty 2

## 2012-02-07 MED ORDER — ASPIRIN 81 MG PO TABS: 81.0000 mg | ORAL_TABLET | Freq: Every day | ORAL | Status: DC

## 2012-02-07 MED ORDER — DOCUSATE SODIUM 100 MG PO CAPS: 100.0000 mg | ORAL_CAPSULE | Freq: Two times a day (BID) | ORAL | Status: DC

## 2012-02-07 MED ORDER — SODIUM CHLORIDE 0.9 % IV SOLN: INTRAVENOUS | Status: AC

## 2012-02-07 MED ORDER — ENOXAPARIN SODIUM 40 MG/0.4ML ~~LOC~~ SOLN
40.0000 mg | SUBCUTANEOUS | Status: DC
  Filled 2012-02-07 (×3): qty 0.4

## 2012-02-07 MED ORDER — SODIUM CHLORIDE 0.9 % IJ SOLN: 3.0000 mL | INTRAMUSCULAR | Status: DC | PRN

## 2012-02-07 MED ORDER — VITAMIN B-12 100 MCG PO TABS
100.0000 ug | ORAL_TABLET | Freq: Every day | ORAL | Status: DC
  Administered 2012-02-08 – 2012-02-09 (×2): 100 ug via ORAL
  Filled 2012-02-07 (×2): qty 1

## 2012-02-07 MED ORDER — LOSARTAN POTASSIUM 50 MG PO TABS
50.0000 mg | ORAL_TABLET | Freq: Every day | ORAL | Status: DC
  Filled 2012-02-07: qty 1

## 2012-02-07 MED ORDER — OXYCODONE HCL 5 MG PO TABS
5.0000 mg | ORAL_TABLET | ORAL | Status: DC | PRN
  Administered 2012-02-08 – 2012-02-09 (×4): 5 mg via ORAL
  Filled 2012-02-07 (×4): qty 1

## 2012-02-07 MED ORDER — CEFOL PO TABS: 1.0000 | ORAL_TABLET | Freq: Every day | ORAL | Status: DC

## 2012-02-07 MED ORDER — ADULT MULTIVITAMIN W/MINERALS CH
1.0000 | ORAL_TABLET | Freq: Every day | ORAL | Status: DC
  Administered 2012-02-08 – 2012-02-09 (×2): 1 via ORAL
  Filled 2012-02-07 (×2): qty 1

## 2012-02-07 NOTE — ED Provider Notes (Signed)
History     CSN: [id number]  Arrival date & time 02/07/12  1756   First MD Initiated Contact with Patient 02/07/12 1804      Chief Complaint  Patient presents with  . Ankle Injury    (Consider location/radiation/quality/duration/timing/severity/associated sxs/prior treatment) HPI  Pt brought to ED by EMS for ankle injury. Pt has Multiple Sclerosis and was getting up from going to the bathroom when her legs gave out on her, which is normal for her, and she twist her right ankle. It is now swollen, tender and painful. She is unable to ambulate on this ankle. She denies back, neck or head pain/injury. The husband says that he can not take her home. VSS/NAD  Past Medical History  Diagnosis Date  . 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 Obesity   . Multiple sclerosis   . Hypertension   . Goiter     On synthroid     Past Surgical History  Procedure Date  . Mastectomy, radical 10/08    right  . Cervical conization w/bx 1973  . Knee arthroscopy w/ meniscal repair 2002  . Bone spur     removed from rt large toe  . Cataract extraction, bilateral     Family History  Problem Relation Age of Onset  . Heart disease Mother   . Cancer Father     prostate cancer  . Heart disease Sister   . Cancer Brother     prostate cancer    History  Substance Use Topics  . Smoking status: Former Games developer  . Smokeless tobacco: Never Used  . Alcohol Use: 8.4 oz/week    7 Glasses of wine, 7 Shots of liquor per week    OB History    Grav Para Term Preterm Abortions TAB SAB Ect Mult Living                  Review of Systems  Review of Systems  Gen: no weight loss, fevers, chills, night sweats  Eyes: no discharge or drainage, no occular pain or visual changes  Nose: no epistaxis or rhinorrhea  Mouth: no dental pain, no sore throat  Neck: no neck pain  Lungs:No wheezing, coughing or hemoptysis CV: no chest pain, palpitations, dependent edema or  orthopnea  Abd: no abdominal pain, nausea, vomiting  GU: no dysuria or gross hematuria  MSK:  Right ankle injury Neuro: no headache, no focal neurologic deficits  Skin: no abnormalities Psyche: negative.   Allergies  Review of patient's allergies indicates no known allergies.  Home Medications   Current Outpatient Rx  Name Route Sig Dispense Refill  . ANASTROZOLE 1 MG PO TABS Oral Take 1 mg by mouth daily.      . ASPIRIN 81 MG PO TABS Oral Take 81 mg by mouth daily.      Marland Kitchen CEFOL PO TABS Oral Take 1 tablet by mouth daily.      Marland Kitchen VITAMIN D 1000 UNITS PO TABS Oral Take 1,000 Units by mouth daily.      . CYANOCOBALAMIN 1000 MCG PO TABS Oral Take 100 mcg by mouth daily.      Marland Kitchen HYDROCHLOROTHIAZIDE 25 MG PO TABS Oral Take 25 mg by mouth daily.      Marland Kitchen LEVOTHYROXINE SODIUM 100 MCG PO TABS Oral Take 100 mcg by mouth daily.      Marland Kitchen LOSARTAN POTASSIUM 50 MG PO TABS Oral Take 50 mg by mouth daily.  BP 131/69  Pulse 96  Temp 98.5 F (36.9 C) (Oral)  Resp 16  SpO2 99%  Physical Exam  Nursing note and vitals reviewed. Constitutional: She appears well-developed and well-nourished. No distress.  HENT:  Head: Normocephalic and atraumatic.  Eyes: Pupils are equal, round, and reactive to light.  Neck: Normal range of motion. Neck supple.  Cardiovascular: Normal rate and regular rhythm.   Pulmonary/Chest: Effort normal.  Abdominal: Soft.  Musculoskeletal:       Left foot: She exhibits decreased range of motion, tenderness, bony tenderness and swelling. She exhibits normal capillary refill, no crepitus, no deformity and no laceration.       Feet:  Neurological: She is alert.  Skin: Skin is warm and dry.    ED Course  Procedures (including critical care time)  Labs Reviewed  POCT I-STAT, CHEM 8 - Abnormal; Notable for the following:    Glucose, Bld 118 (*)     Hemoglobin 15.3 (*)     All other components within normal limits   Dg Ankle Complete Right  02/07/2012  *RADIOLOGY  REPORT*  Clinical Data: Ankle injury  RIGHT ANKLE - COMPLETE 3+ VIEW  Comparison: None.  Findings: Three views of the right ankle submitted.  Ankle mortise is preserved.  There is a subtle lucent line at the tip of distal fibula with some cortical irregularity suspicious for a subtle nondisplaced fracture.  Clinical correlation is necessary.  There is adjacent soft tissue swelling.  IMPRESSION: Subtle lucent line at the tip of distal fibula with some cortical irregularity suspicious for a subtle nondisplaced fracture. Clinical correlation is necessary.  There is adjacent soft tissue swelling.   Original Report Authenticated By: Natasha Mead, M.D.    US Thyroid Biopsy  02/07/2012  *RADIOLOGY REPORT*  Indication: Indeterminate bilateral thyroid nodules  ULTRASOUND GUIDED THYROID FINE NEEDLE ASPIRATION  Comparisons: Thyroid Ultrasound - 01/25/2011  Intravenous Medications: None  Complications: None immediate  Technique / Findings:  Informed written consent was obtained from the patient after a discussion of the risks, benefits and alternatives to treatment. Questions regarding the procedure were encouraged and answered.  A timeout was performed prior to the initiation of the procedure.  Pre-procedural ultrasound scanning demonstrated possible interval erosive and dominant right-sided thyroid nodule extension with extension into the thyroid isthmus, now measuring approximately 5.3 cm in greatest transverse dimension (image 3) previously, 4.9 cm. The previously described partially cystic, primarily solid nodule within the left lobe of the thyroid appears to contain more solid component and also appears increased in size, measuring approximately 2.4 cm in greatest transverse dimension (image 5) previously, 2.2 cm.  The procedure was planned.  The neck was prepped in the usual sterile fashion, and a sterile drape was applied covering the operative field.  A timeout was performed prior to the initiation of the procedure.   Local anesthesia was provided with 1% lidocaine.  Under direct ultrasound guidance, 3 FNA biopsies were performed of the dominant right sided nodule with a 25 gauge needle.  The samples were prepared and submitted to pathology.  Limited post procedural scanning was negative for hematoma or additional complication.  Under direct ultrasound guidance, three FNA biopsies were performed of the dominant left-sided nodule with a 25 gauge needle.  The samples were prepared and submitted to pathology.  Limited post procedure scanning was negative for hematoma or additional complication.  Dressings were placed.  The patient tolerated the above procedures well without immediate postprocedural complication.  Impression:  Technically successful ultrasound  guided fine needle aspiration of dominant bilateral thyroid nodules, both of which have increased in size since most recent diagnostic thyroid ultrasound performed in 01/2011.   Original Report Authenticated By: Waynard Reeds, M.D.    US Thyroid Biopsy  02/07/2012  *RADIOLOGY REPORT*  Indication: Indeterminate bilateral thyroid nodules  ULTRASOUND GUIDED THYROID FINE NEEDLE ASPIRATION  Comparisons: Thyroid Ultrasound - 01/25/2011  Intravenous Medications: None  Complications: None immediate  Technique / Findings:  Informed written consent was obtained from the patient after a discussion of the risks, benefits and alternatives to treatment. Questions regarding the procedure were encouraged and answered.  A timeout was performed prior to the initiation of the procedure.  Pre-procedural ultrasound scanning demonstrated possible interval erosive and dominant right-sided thyroid nodule extension with extension into the thyroid isthmus, now measuring approximately 5.3 cm in greatest transverse dimension (image 3) previously, 4.9 cm. The previously described partially cystic, primarily solid nodule within the left lobe of the thyroid appears to contain more solid component and  also appears increased in size, measuring approximately 2.4 cm in greatest transverse dimension (image 5) previously, 2.2 cm.  The procedure was planned.  The neck was prepped in the usual sterile fashion, and a sterile drape was applied covering the operative field.  A timeout was performed prior to the initiation of the procedure.  Local anesthesia was provided with 1% lidocaine.  Under direct ultrasound guidance, 3 FNA biopsies were performed of the dominant right sided nodule with a 25 gauge needle.  The samples were prepared and submitted to pathology.  Limited post procedural scanning was negative for hematoma or additional complication.  Under direct ultrasound guidance, three FNA biopsies were performed of the dominant left-sided nodule with a 25 gauge needle.  The samples were prepared and submitted to pathology.  Limited post procedure scanning was negative for hematoma or additional complication.  Dressings were placed.  The patient tolerated the above procedures well without immediate postprocedural complication.  Impression:  Technically successful ultrasound guided fine needle aspiration of dominant bilateral thyroid nodules, both of which have increased in size since most recent diagnostic thyroid ultrasound performed in 01/2011.   Original Report Authenticated By: Waynard Reeds, M.D.      1. Closed right fibular fracture       MDM  Due to patient having MS and being unable to ambulate she will be admitted. Husband says that he can not take her home as he is unable to lift her. I have spoken with [facility], Dr. Nedra Hai who has agreed to admit this patient. Most likely patient will need admission to a rehab facility.  Will order Cam-Walker, pt declines pain medication, ice applied to ankle.  [facility], Andres Ege, inpatient        Dorthula Matas, PA 02/07/12 1941  Dorthula Matas, PA 02/07/12 1942

## 2012-02-07 NOTE — ED Notes (Signed)
Patient transported to X-ray 

## 2012-02-07 NOTE — ED Notes (Signed)
Per EMS: Pt has hx of MS. Pt fell at work today, denies loc, neck or back pain. Reports injury to right ankle with swelling and pain. No deformity or crepitus noted. 18G IV started to left hand pta. Pt refused pain meds. VSS: 140/62; HR: 88

## 2012-02-07 NOTE — ED Notes (Signed)
Pt returned from xray. NAD noted at this time. Husband at bedside.

## 2012-02-07 NOTE — ED Provider Notes (Signed)
Medical screening examination/treatment/procedure(s) were performed by non-physician practitioner and as supervising physician I was immediately available for consultation/collaboration.   Gwyneth Sprout, MD 02/07/12 2321

## 2012-02-07 NOTE — ED Notes (Signed)
ZOX:[id number]> Expected date:02/07/12<BR> Expected time: 5:40 PM<BR> Means of arrival:Ambulance<BR> Comments:<BR> Ankle injury, IV established

## 2012-02-07 NOTE — Progress Notes (Signed)
Patient is ordered lovenox for VTE purposes.  Patient refused lovenox.  I informed the patient the purpose of the lovenox was to prevent DVT's and blood clots that she is at risk for developing while in the hospital.  I informed the patient that blood clots and DVT's could lead to complications including death.  Patient states that she is fully aware of the risks of refusing lovenox including the potential risk of death.  Unable to use mechanical VTE prophylaxis on right leg because of fracture per Dr. Conley Rolls.  I spoke to Dr. Conley Rolls regarding the patient's refusal of Lovenox, he is aware, will continue to monitor.

## 2012-02-07 NOTE — H&P (Signed)
[facility] History and Physical  Colleen [name] NFA:[id number] DOB: 04/17/1946    PCP:   Colleen Otto, MD   Chief Complaint: right ankle pain after a fall, unable to ambulate.  HPI: Colleen [name] is an 66 y.o. female with Hx of Multiple Sclerosis resulting in bilateral lower extremity weakness, fell and had a slight non-displaced fracture of the distal fibula.  She insisted on being admitted and said she cannot manage at home if she cannot walk.  Her house has steps and she just cannot function.  She said it has been comtemplated that she would go to an assisted living, even prior to this fall, and that her husband is hoping to be able to secure the assited living facility by tomorrow.  She has no other complaints.  Rewiew of Systems:  Constitutional: Negative for malaise, fever and chills. No significant weight loss or weight gain Eyes: Negative for eye pain, redness and discharge, diplopia, visual changes, or flashes of light. ENMT: Negative for ear pain, hoarseness, nasal congestion, sinus pressure and sore throat. No headaches; tinnitus, drooling, or problem swallowing. Cardiovascular: Negative for chest pain, palpitations, diaphoresis, dyspnea and peripheral edema. ; No orthopnea, PND Respiratory: Negative for cough, hemoptysis, wheezing and stridor. No pleuritic chestpain. Gastrointestinal: Negative for nausea, vomiting, diarrhea, constipation, abdominal pain, melena, blood in stool, hematemesis, jaundice and rectal bleeding.    Genitourinary: Negative for frequency, dysuria, incontinence,flank pain and hematuria; Musculoskeletal: Negative for back pain and neck pain. Negative for swelling and trauma.;  Skin: . Negative for pruritus, rash, abrasions, bruising and skin lesion.; ulcerations Neuro: Negative for headache, lightheadedness and neck stiffness. Negative for weakness, altered level of consciousness , altered mental status,  burning feet, involuntary movement, seizure  and syncope.  Psych: negative for anxiety, depression, insomnia, tearfulness, panic attacks, hallucinations, paranoia, suicidal or homicidal ideation    Past Medical History  Diagnosis Date  . 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 Obesity   . Multiple sclerosis   . Hypertension   . Goiter     On synthroid     Past Surgical History  Procedure Date  . Mastectomy, radical 10/08    right  . Cervical conization w/bx 1973  . Knee arthroscopy w/ meniscal repair 2002  . Bone spur     removed from rt large toe  . Cataract extraction, bilateral     Medications:  HOME MEDS: Prior to Admission medications   Medication Sig Start Date End Date Taking? Authorizing Provider  anastrozole (ARIMIDEX) 1 MG tablet Take 1 mg by mouth daily.     Yes Historical Provider, MD  aspirin 81 MG tablet Take 81 mg by mouth daily.     Yes Historical Provider, MD  B Complex-C-Folic Acid (MULTIVITAMIN, STRESS FORMULA) tablet Take 1 tablet by mouth daily.     Yes Historical Provider, MD  cholecalciferol (VITAMIN D) 1000 UNITS tablet Take 1,000 Units by mouth daily.     Yes Historical Provider, MD  cyanocobalamin 1000 MCG tablet Take 100 mcg by mouth daily.     Yes Historical Provider, MD  hydrochlorothiazide (HYDRODIURIL) 25 MG tablet Take 25 mg by mouth daily.     Yes Historical Provider, MD  levothyroxine (SYNTHROID, LEVOTHROID) 100 MCG tablet Take 100 mcg by mouth daily.     Yes Historical Provider, MD  losartan (COZAAR) 50 MG tablet Take 50 mg by mouth daily.     Yes Historical Provider, MD  Allergies:  No Known Allergies  Social History:   reports that she has quit smoking. She has never used smokeless tobacco. She reports that she drinks about 8.4 ounces of alcohol per week. She reports that she does not use illicit drugs.  Family History: Family History  Problem Relation Age of Onset  . Heart disease Mother   . Cancer Father     prostate cancer  . Heart  disease Sister   . Cancer Brother     prostate cancer     Physical Exam: Filed Vitals:   02/07/12 1809 02/07/12 2019 02/07/12 2205  BP: 131/69 125/77   Pulse: 96 110   Temp: 98.5 F (36.9 C) 98.4 F (36.9 C)   TempSrc: Oral Oral   Resp: 16 20   Height:   5\' 4"  (1.626 m)  Weight:   99.791 kg (220 lb)  SpO2: 99% 96%    Blood pressure 125/77, pulse 110, temperature 98.4 F (36.9 C), temperature source Oral, resp. rate 20, height 5\' 4"  (1.626 m), weight 99.791 kg (220 lb), SpO2 96.00%.  GEN:  Pleasant  patient lying in the stretcher in no acute distress; cooperative with exam. PSYCH:  alert and oriented x4; does not appear anxious or depressed; affect is appropriate. HEENT: Mucous membranes pink and anicteric; PERRLA; EOM intact; no cervical lymphadenopathy nor thyromegaly or carotid bruit; no JVD; There were no stridor. Neck is very supple. Breasts:: Not examined CHEST WALL: No tenderness CHEST: Normal respiration, clear to auscultation bilaterally.  HEART: Regular rate and rhythm.  There are no murmur, rub, or gallops.   BACK: No kyphosis or scoliosis; no CVA tenderness ABDOMEN: soft and non-tender; no masses, no organomegaly, normal abdominal bowel sounds; no pannus; no intertriginous candida. There is no rebound and no distention. Rectal Exam: Not done EXTREMITIES: No bone or joint deformity; age-appropriate arthropathy of the hands and knees; no edema; no ulcerations.  There is no calf tenderness. Genitalia: not examined PULSES: 2+ and symmetric SKIN: Normal hydration no rash or ulceration CNS: Cranial nerves 2-12 grossly intact no focal lateralizing neurologic deficit.  Speech is fluent; uvula elevated with phonation, facial symmetry and tongue midline.  She has slightly weaker lower leg strengths.  Labs on Admission:  Basic Metabolic Panel:  Lab 02/07/12 9562 02/07/12 1935  NA -- 138  K -- 3.8  CL -- 104  CO2 -- --  GLUCOSE -- 118*  BUN -- 19  CREATININE 0.83 1.00    CALCIUM -- --  MG -- --  PHOS -- --   Liver Function Tests: No results found for this basename: AST:5,ALT:5,ALKPHOS:5,BILITOT:5,PROT:5,ALBUMIN:5 in the last 168 hours No results found for this basename: LIPASE:5,AMYLASE:5 in the last 168 hours No results found for this basename: AMMONIA:5 in the last 168 hours CBC:  Lab 02/07/12 2030 02/07/12 1935  WBC 12.9* --  NEUTROABS -- --  HGB 15.3* 15.3*  HCT 41.7 45.0  MCV 80.8 --  PLT 364 --   Cardiac Enzymes: No results found for this basename: CKTOTAL:5,CKMB:5,CKMBINDEX:5,TROPONINI:5 in the last 168 hours  CBG: No results found for this basename: GLUCAP:5 in the last 168 hours   Radiological Exams on Admission: Dg Ankle Complete Right  02/07/2012  *RADIOLOGY REPORT*  Clinical Data: Ankle injury  RIGHT ANKLE - COMPLETE 3+ VIEW  Comparison: None.  Findings: Three views of the right ankle submitted.  Ankle mortise is preserved.  There is a subtle lucent line at the tip of distal fibula with some cortical irregularity suspicious for  a subtle nondisplaced fracture.  Clinical correlation is necessary.  There is adjacent soft tissue swelling.  IMPRESSION: Subtle lucent line at the tip of distal fibula with some cortical irregularity suspicious for a subtle nondisplaced fracture. Clinical correlation is necessary.  There is adjacent soft tissue swelling.   Original Report Authenticated By: Natasha Mead, M.D.    US Thyroid Biopsy  02/07/2012  *RADIOLOGY REPORT*  Indication: Indeterminate bilateral thyroid nodules  ULTRASOUND GUIDED THYROID FINE NEEDLE ASPIRATION  Comparisons: Thyroid Ultrasound - 01/25/2011  Intravenous Medications: None  Complications: None immediate  Technique / Findings:  Informed written consent was obtained from the patient after a discussion of the risks, benefits and alternatives to treatment. Questions regarding the procedure were encouraged and answered.  A timeout was performed prior to the initiation of the procedure.   Pre-procedural ultrasound scanning demonstrated possible interval erosive and dominant right-sided thyroid nodule extension with extension into the thyroid isthmus, now measuring approximately 5.3 cm in greatest transverse dimension (image 3) previously, 4.9 cm. The previously described partially cystic, primarily solid nodule within the left lobe of the thyroid appears to contain more solid component and also appears increased in size, measuring approximately 2.4 cm in greatest transverse dimension (image 5) previously, 2.2 cm.  The procedure was planned.  The neck was prepped in the usual sterile fashion, and a sterile drape was applied covering the operative field.  A timeout was performed prior to the initiation of the procedure.  Local anesthesia was provided with 1% lidocaine.  Under direct ultrasound guidance, 3 FNA biopsies were performed of the dominant right sided nodule with a 25 gauge needle.  The samples were prepared and submitted to pathology.  Limited post procedural scanning was negative for hematoma or additional complication.  Under direct ultrasound guidance, three FNA biopsies were performed of the dominant left-sided nodule with a 25 gauge needle.  The samples were prepared and submitted to pathology.  Limited post procedure scanning was negative for hematoma or additional complication.  Dressings were placed.  The patient tolerated the above procedures well without immediate postprocedural complication.  Impression:  Technically successful ultrasound guided fine needle aspiration of dominant bilateral thyroid nodules, both of which have increased in size since most recent diagnostic thyroid ultrasound performed in 01/2011.   Original Report Authenticated By: Waynard Reeds, M.D.    US Thyroid Biopsy  02/07/2012  *RADIOLOGY REPORT*  Indication: Indeterminate bilateral thyroid nodules  ULTRASOUND GUIDED THYROID FINE NEEDLE ASPIRATION  Comparisons: Thyroid Ultrasound - 01/25/2011  Intravenous  Medications: None  Complications: None immediate  Technique / Findings:  Informed written consent was obtained from the patient after a discussion of the risks, benefits and alternatives to treatment. Questions regarding the procedure were encouraged and answered.  A timeout was performed prior to the initiation of the procedure.  Pre-procedural ultrasound scanning demonstrated possible interval erosive and dominant right-sided thyroid nodule extension with extension into the thyroid isthmus, now measuring approximately 5.3 cm in greatest transverse dimension (image 3) previously, 4.9 cm. The previously described partially cystic, primarily solid nodule within the left lobe of the thyroid appears to contain more solid component and also appears increased in size, measuring approximately 2.4 cm in greatest transverse dimension (image 5) previously, 2.2 cm.  The procedure was planned.  The neck was prepped in the usual sterile fashion, and a sterile drape was applied covering the operative field.  A timeout was performed prior to the initiation of the procedure.  Local anesthesia was provided with  1% lidocaine.  Under direct ultrasound guidance, 3 FNA biopsies were performed of the dominant right sided nodule with a 25 gauge needle.  The samples were prepared and submitted to pathology.  Limited post procedural scanning was negative for hematoma or additional complication.  Under direct ultrasound guidance, three FNA biopsies were performed of the dominant left-sided nodule with a 25 gauge needle.  The samples were prepared and submitted to pathology.  Limited post procedure scanning was negative for hematoma or additional complication.  Dressings were placed.  The patient tolerated the above procedures well without immediate postprocedural complication.  Impression:  Technically successful ultrasound guided fine needle aspiration of dominant bilateral thyroid nodules, both of which have increased in size since most  recent diagnostic thyroid ultrasound performed in 01/2011.   Original Report Authenticated By: Waynard Reeds, M.D.     Assessment/Plan Present on Admission:  .Fracture, fibula .Multiple sclerosis .Leg weakness, bilateral .Hypothyroidism   PLAN:  Will admit for obs.  Give pain meds.  Hopefully, she will be able to secure an assisted living and be discharged in the am.  I will consult social service to help with disposition.  She is stable, full code, and will be admitted to [facility] service.  Other plans as per orders.  Code Status: FULL Unk Lightning, MD. [facility] Pager [phone] 7pm to 7am.  02/07/2012, 10:19 PM

## 2012-02-08 DIAGNOSIS — C50919 Malignant neoplasm of unspecified site of unspecified female breast: Secondary | ICD-10-CM | POA: Diagnosis present

## 2012-02-08 DIAGNOSIS — I10 Essential (primary) hypertension: Secondary | ICD-10-CM | POA: Diagnosis present

## 2012-02-08 NOTE — Progress Notes (Signed)
CSW assisting with d/c planning. [facility] has assessed pt and recommends ST rehab prior to ALF placement. Pt is agreeable with this plan and SNF search has been initiated. CSW will provide bed offers in the am. PT Eval is pending.  Colleen [name] Mahika Vanvoorhis LCSW [phone]

## 2012-02-08 NOTE — Consult Note (Signed)
NAME: Colleen [name] MRN:   [id number] DOB:   October 20, 1945     HISTORY AND PHYSICAL  CHIEF COMPLAINT:  66 year old female with history of breast cancer, multiple sclerosis presented with right fibula fracture status post fall status post fall.   HISTORY:   Colleen Breon Riceis a 66 y.o. female  with right  Ankle Pain Patient complains of right ankle pain. Onset of the symptoms was today. Inciting event: injured while s/p fall. Current symptoms include: ability to bear weight, but with some pain. Aggravating factors: direct pressure. Symptoms have gradually worsened. Patient has had prior ankle problems. Previous visits for this problem: none.  Evaluation to date: plain films: abnormal fibula fracture.    PAST MEDICAL HISTORY:   Past Medical History  Diagnosis Date  . 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 Obesity   . Multiple sclerosis   . Hypertension   . Goiter     On synthroid     PAST SURGICAL HISTORY:   Past Surgical History  Procedure Date  . Mastectomy, radical 10/08    right  . Cervical conization w/bx 1973  . Knee arthroscopy w/ meniscal repair 2002  . Bone spur     removed from rt large toe  . Cataract extraction, bilateral     MEDICATIONS:   Medications Prior to Admission  Medication Sig Dispense Refill  . anastrozole (ARIMIDEX) 1 MG tablet Take 1 mg by mouth daily.        Marland Kitchen aspirin 81 MG tablet Take 81 mg by mouth daily.        . B Complex-C-Folic Acid (MULTIVITAMIN, STRESS FORMULA) tablet Take 1 tablet by mouth daily.        . cholecalciferol (VITAMIN D) 1000 UNITS tablet Take 1,000 Units by mouth daily.        . cyanocobalamin 1000 MCG tablet Take 100 mcg by mouth daily.        . hydrochlorothiazide (HYDRODIURIL) 25 MG tablet Take 25 mg by mouth daily.        Marland Kitchen levothyroxine (SYNTHROID, LEVOTHROID) 100 MCG tablet Take 100 mcg by mouth daily.        Marland Kitchen losartan (COZAAR) 50 MG tablet Take 50 mg by mouth daily.          ALLERGIES:  No  Known Allergies  REVIEW OF SYSTEMS:   Negative except hpi  FAMILY HISTORY:   Family History  Problem Relation Age of Onset  . Heart disease Mother   . Cancer Father     prostate cancer  . Heart disease Sister   . Cancer Brother     prostate cancer    SOCIAL HISTORY:   reports that she has quit smoking. She has never used smokeless tobacco. She reports that she drinks about 8.4 ounces of alcohol per week. She reports that she does not use illicit drugs.  PHYSICAL EXAM:  General appearance: alert, cooperative and no distress Resp: clear to auscultation bilaterally Cardio: regular rate and rhythm, S1, S2 normal, no murmur, click, rub or gallop GI: soft, non-tender; bowel sounds normal; no masses,  no organomegaly Extremities: Homans sign is negative, no sign of DVT    LABORATORY STUDIES:  Basename Mar 08, 2012 2030 Mar 08, 2012 1935  WBC 12.9* --  HGB 15.3* 15.3*  HCT 41.7 45.0  PLT 364 --     Basename Mar 08, 2012 2030 03/08/12 1935  NA -- 138  K -- 3.8  CL -- 104  CO2 -- --  GLUCOSE -- 118*  BUN -- 19  CREATININE 0.83 1.00  CALCIUM -- --    STUDIES/RESULTS:  Dg Ankle Complete Right  02/07/2012  *RADIOLOGY REPORT*  Clinical Data: Ankle injury  RIGHT ANKLE - COMPLETE 3+ VIEW  Comparison: None.  Findings: Three views of the right ankle submitted.  Ankle mortise is preserved.  There is a subtle lucent line at the tip of distal fibula with some cortical irregularity suspicious for a subtle nondisplaced fracture.  Clinical correlation is necessary.  There is adjacent soft tissue swelling.  IMPRESSION: Subtle lucent line at the tip of distal fibula with some cortical irregularity suspicious for a subtle nondisplaced fracture. Clinical correlation is necessary.  There is adjacent soft tissue swelling.   Original Report Authenticated By: Natasha Mead, M.D.    US Thyroid Biopsy  02/07/2012  *RADIOLOGY REPORT*  Indication: Indeterminate bilateral thyroid nodules  ULTRASOUND GUIDED THYROID FINE  NEEDLE ASPIRATION  Comparisons: Thyroid Ultrasound - 01/25/2011  Intravenous Medications: None  Complications: None immediate  Technique / Findings:  Informed written consent was obtained from the patient after a discussion of the risks, benefits and alternatives to treatment. Questions regarding the procedure were encouraged and answered.  A timeout was performed prior to the initiation of the procedure.  Pre-procedural ultrasound scanning demonstrated possible interval erosive and dominant right-sided thyroid nodule extension with extension into the thyroid isthmus, now measuring approximately 5.3 cm in greatest transverse dimension (image 3) previously, 4.9 cm. The previously described partially cystic, primarily solid nodule within the left lobe of the thyroid appears to contain more solid component and also appears increased in size, measuring approximately 2.4 cm in greatest transverse dimension (image 5) previously, 2.2 cm.  The procedure was planned.  The neck was prepped in the usual sterile fashion, and a sterile drape was applied covering the operative field.  A timeout was performed prior to the initiation of the procedure.  Local anesthesia was provided with 1% lidocaine.  Under direct ultrasound guidance, 3 FNA biopsies were performed of the dominant right sided nodule with a 25 gauge needle.  The samples were prepared and submitted to pathology.  Limited post procedural scanning was negative for hematoma or additional complication.  Under direct ultrasound guidance, three FNA biopsies were performed of the dominant left-sided nodule with a 25 gauge needle.  The samples were prepared and submitted to pathology.  Limited post procedure scanning was negative for hematoma or additional complication.  Dressings were placed.  The patient tolerated the above procedures well without immediate postprocedural complication.  Impression:  Technically successful ultrasound guided fine needle aspiration of dominant  bilateral thyroid nodules, both of which have increased in size since most recent diagnostic thyroid ultrasound performed in 01/2011.   Original Report Authenticated By: Waynard Reeds, M.D.    US Thyroid Biopsy  02/07/2012  *RADIOLOGY REPORT*  Indication: Indeterminate bilateral thyroid nodules  ULTRASOUND GUIDED THYROID FINE NEEDLE ASPIRATION  Comparisons: Thyroid Ultrasound - 01/25/2011  Intravenous Medications: None  Complications: None immediate  Technique / Findings:  Informed written consent was obtained from the patient after a discussion of the risks, benefits and alternatives to treatment. Questions regarding the procedure were encouraged and answered.  A timeout was performed prior to the initiation of the procedure.  Pre-procedural ultrasound scanning demonstrated possible interval erosive and dominant right-sided thyroid nodule extension with extension into the thyroid isthmus, now measuring approximately 5.3 cm in greatest transverse dimension (image 3) previously, 4.9 cm. The previously described partially cystic, primarily  solid nodule within the left lobe of the thyroid appears to contain more solid component and also appears increased in size, measuring approximately 2.4 cm in greatest transverse dimension (image 5) previously, 2.2 cm.  The procedure was planned.  The neck was prepped in the usual sterile fashion, and a sterile drape was applied covering the operative field.  A timeout was performed prior to the initiation of the procedure.  Local anesthesia was provided with 1% lidocaine.  Under direct ultrasound guidance, 3 FNA biopsies were performed of the dominant right sided nodule with a 25 gauge needle.  The samples were prepared and submitted to pathology.  Limited post procedural scanning was negative for hematoma or additional complication.  Under direct ultrasound guidance, three FNA biopsies were performed of the dominant left-sided nodule with a 25 gauge needle.  The samples were  prepared and submitted to pathology.  Limited post procedure scanning was negative for hematoma or additional complication.  Dressings were placed.  The patient tolerated the above procedures well without immediate postprocedural complication.  Impression:  Technically successful ultrasound guided fine needle aspiration of dominant bilateral thyroid nodules, both of which have increased in size since most recent diagnostic thyroid ultrasound performed in 01/2011.   Original Report Authenticated By: Waynard Reeds, M.D.     ASSESSMENT:  right ankle fracture        Principal Problem:  *Fracture, fibula Active Problems:  Multiple sclerosis  Hypothyroidism  Breast cancer  Hypertension    PLAN:  1.pain control 2. Equalizer 3. Follow up Tuesday with dr Sherlean Foot [phone] wbat in boot   Tiarah Shisler 02/08/2012. 7:22 PM

## 2012-02-08 NOTE — Progress Notes (Addendum)
[facility] PROGRESS NOTE  Colleen [name] VHQ:[id number] DOB: 18-Jan-1946 DOA: Feb 24, 2012 PCP: Ginette Otto, MD  Brief narrative: 66 year old female with history of breast cancer, multiple sclerosis presented with right fibula fracture status post fall status post fall.  Assessment/Plan:  Principal Problem:  *Fracture, fibula - Appreciate orthopedics consult - Continue current pain regimen with oxycodone 5 mg every 4 hours as needed for moderate pain  Active Problems:  Hypothyroidism - Continue Synthroid 100 mcg daily  Hypertension - Blood pressure at goal - Daily HCTZ 25 mg daily and losartan 50 mg daily  Breast cancer - Continue anastrozole 1 mg daily  Code Status: full code Family Communication: no family at bedside Disposition Plan: needs PT eval; plan for discharge in next 24 hours  Manson Passey, MD  [facility] Pager [phone]  If 7PM-7AM, please contact night-coverage [email or web address] Password [email or web address] 02/08/2012, 12:01 PM   LOS: 1 day   Consultants:  Ortho   Procedures:  None  Antibiotics:  None  HPI/Subjective: Complains of right ankle pain.  Objective: Filed Vitals:   Feb 24, 2012 1809 2012/02/24 2019 2012-02-24 2205 02/08/12 0619  BP: 131/69 125/77  125/79  Pulse: 96 110  85  Temp: 98.5 F (36.9 C) 98.4 F (36.9 C)  98.5 F (36.9 C)  TempSrc: Oral Oral  Oral  Resp: 16 20  20   Height:   5\' 4"  (1.626 m)   Weight:   99.791 kg (220 lb)   SpO2: 99% 96%  98%    Intake/Output Summary (Last 24 hours) at 02/08/12 1201 Last data filed at 02/08/12 1120  Gross per 24 hour  Intake    603 ml  Output     50 ml  Net    553 ml    Exam:   General:  Pt is alert, follows commands appropriately, not in acute distress; morbid obesity  Cardiovascular: Regular rate and rhythm, S1/S2, no murmurs, no rubs, no gallops  Respiratory: Clear to auscultation bilaterally, no wheezing, no crackles, no rhonchi  Abdomen: Soft, non tender, non distended, bowel sounds  present, no guarding  Extremities: No edema, pulses DP and PT palpable bilaterally  Neuro: Grossly nonfocal  Data Reviewed: Basic Metabolic Panel:  Lab 02/24/12 1027 2012/02/24 1935  NA -- 138  K -- 3.8  CL -- 104  CO2 -- --  GLUCOSE -- 118*  BUN -- 19  CREATININE 0.83 1.00  CALCIUM -- --  MG -- --  PHOS -- --   CBC:  Lab 2012-02-24 2030 24-Feb-2012 1935  WBC 12.9* --  HGB 15.3* 15.3*  HCT 41.7 45.0  MCV 80.8 --  PLT 364 --    Studies: Dg Ankle Complete Right 02-24-12  * IMPRESSION: Subtle lucent line at the tip of distal fibula with some cortical irregularity suspicious for a subtle nondisplaced fracture. Clinical correlation is necessary.  There is adjacent soft tissue swelling.     US Thyroid Biopsy Feb 24, 2012  *RADIOLOGY REPORT*  Indication: Indeterminate bilateral thyroid nodules  ULTRASOUND GUIDED THYROID FINE NEEDLE ASPIRATION  Comparisons: Thyroid Ultrasound - 01/25/2011  Intravenous Medications: None  Complications: None immediate  Technique / Findings:  Informed written consent was obtained from the patient after a discussion of the risks, benefits and alternatives to treatment. Questions regarding the procedure were encouraged and answered.  A timeout was performed prior to the initiation of the procedure.  Pre-procedural ultrasound scanning demonstrated possible interval erosive and dominant right-sided thyroid nodule extension with extension into the thyroid isthmus, now measuring approximately 5.3  cm in greatest transverse dimension (image 3) previously, 4.9 cm. The previously described partially cystic, primarily solid nodule within the left lobe of the thyroid appears to contain more solid component and also appears increased in size, measuring approximately 2.4 cm in greatest transverse dimension (image 5) previously, 2.2 cm.  The procedure was planned.  The neck was prepped in the usual sterile fashion, and a sterile drape was applied covering the operative field.  A  timeout was performed prior to the initiation of the procedure.  Local anesthesia was provided with 1% lidocaine.  Under direct ultrasound guidance, 3 FNA biopsies were performed of the dominant right sided nodule with a 25 gauge needle.  The samples were prepared and submitted to pathology.  Limited post procedural scanning was negative for hematoma or additional complication.  Under direct ultrasound guidance, three FNA biopsies were performed of the dominant left-sided nodule with a 25 gauge needle.  The samples were prepared and submitted to pathology.  Limited post procedure scanning was negative for hematoma or additional complication.  Dressings were placed.  The patient tolerated the above procedures well without immediate postprocedural complication.  Impression:  Technically successful ultrasound guided fine needle aspiration of dominant bilateral thyroid nodules, both of which have increased in size since most recent diagnostic thyroid ultrasound performed in 01/2011.   Original Report Authenticated By: Waynard Reeds, M.D.    US Thyroid Biopsy 02/07/2012  *RADIOLOGY REPORT*  Indication: Indeterminate bilateral thyroid nodules  ULTRASOUND GUIDED THYROID FINE NEEDLE ASPIRATION  Comparisons: Thyroid Ultrasound - 01/25/2011  Intravenous Medications: None  Complications: None immediate  Technique / Findings:  Informed written consent was obtained from the patient after a discussion of the risks, benefits and alternatives to treatment. Questions regarding the procedure were encouraged and answered.  A timeout was performed prior to the initiation of the procedure.  Pre-procedural ultrasound scanning demonstrated possible interval erosive and dominant right-sided thyroid nodule extension with extension into the thyroid isthmus, now measuring approximately 5.3 cm in greatest transverse dimension (image 3) previously, 4.9 cm. The previously described partially cystic, primarily solid nodule within the left lobe  of the thyroid appears to contain more solid component and also appears increased in size, measuring approximately 2.4 cm in greatest transverse dimension (image 5) previously, 2.2 cm.  The procedure was planned.  The neck was prepped in the usual sterile fashion, and a sterile drape was applied covering the operative field.  A timeout was performed prior to the initiation of the procedure.  Local anesthesia was provided with 1% lidocaine.  Under direct ultrasound guidance, 3 FNA biopsies were performed of the dominant right sided nodule with a 25 gauge needle.  The samples were prepared and submitted to pathology.  Limited post procedural scanning was negative for hematoma or additional complication.  Under direct ultrasound guidance, three FNA biopsies were performed of the dominant left-sided nodule with a 25 gauge needle.  The samples were prepared and submitted to pathology.  Limited post procedure scanning was negative for hematoma or additional complication.  Dressings were placed.  The patient tolerated the above procedures well without immediate postprocedural complication.  Impression:  Technically successful ultrasound guided fine needle aspiration of dominant bilateral thyroid nodules, both of which have increased in size since most recent diagnostic thyroid ultrasound performed in 01/2011.   Original Report Authenticated By: Waynard Reeds, M.D.     Scheduled Meds:   . anastrozole  1 mg Oral Daily  . aspirin  81 mg  Oral Daily  . cholecalciferol  1,000 Units Oral Daily  . docusate sodium  100 mg Oral BID  . enoxaparin (LOVENOX)  40 mg Subcutaneous Q24H  . hydrochlorothiazide  25 mg Oral Daily  . levothyroxine  100 mcg Oral QAC breakfast  . losartan  50 mg Oral Daily  . multivitamin   1 tablet Oral Daily  . vitamin B-12  100 mcg Oral Daily

## 2012-02-08 NOTE — Progress Notes (Signed)
CARE MANAGEMENT NOTE 02/08/2012  Patient:  [facility]   Account Number:  [id number]  Date Initiated:  02/08/2012  Documentation initiated by:  Colleen Can  Subjective/Objective Assessment:   dx fall sustaining distal fibula fracture     Action/Plan:   SNF -ST   Anticipated DC Date:  02/09/2012   Anticipated DC Plan:  SKILLED NURSING FACILITY  In-house referral  Clinical Social Worker      DC Planning Services  CM consult      [facility] Choice  NA   Choice offered to / List presented to:  NA   DME arranged  NA      DME agency  NA     HH arranged  NA      HH agency  NA   Status of service:  Completed, signed off Medicare Important Message given?  NO

## 2012-02-08 NOTE — Progress Notes (Signed)
Utilization review completed.  

## 2012-02-08 NOTE — Progress Notes (Signed)
Patient is ordered lovenox for VTE purposes. Patient continues to refused lovenox. Reinforced the reason why lovenox was ordered-to prevent DVT's and blood clots that she is at risk for developing while in the hospital. Patient states that she is fully aware of the risks of refusing lovenox including the potential risk of death. Unable to use mechanical VTE prophylaxis on right leg because of fracture per Dr. Conley Rolls. Will continue to monitor patient.

## 2012-02-08 NOTE — Progress Notes (Signed)
Clinical Social Work Department BRIEF PSYCHOSOCIAL ASSESSMENT 02/08/2012  Patient:  Colleen [name], Colleen [name]     Account Number:  [id number]     Admit date:  02/07/2012  Clinical Social Worker:  Candie Chroman  Date/Time:  02/08/2012 01:49 PM  Referred by:  Physician  Date Referred:  02/08/2012 Referred for  SNF Placement   Other Referral:   Interview type:  Patient Other interview type:    PSYCHOSOCIAL DATA Living Status:  HUSBAND Admitted from facility:   Level of care:   Primary support name:  Fredda Hammed Primary support relationship to patient:  SPOUSE Degree of support available:   limited    CURRENT CONCERNS Current Concerns  Post-Acute Placement   Other Concerns:    SOCIAL WORK ASSESSMENT / PLAN Pt is a 66 yr old married female living at home prior to hospitalization.CSW met with pt/daughter today to assist with d/c planning needs. Pt states she is unable to return home and is interested in ALF placement. Pt has contacted [facility] for possible admission. CSW has contacted ALF and provided FL2/clinicals. Assessment by ALF will be done today. PT Eval is also pending. CSW will continue to follow to assist with d/c planning needs.   Assessment/plan status:  Psychosocial Support/Ongoing Assessment of Needs Other assessment/ plan:   Information/referral to community resources:   None at this time. Additional ALF info may be needed and will be provided at that time.    PATIENT'S/FAMILY'S RESPONSE TO PLAN OF CARE: Pt does not feel she can manage at home safely. Family supports this decision. Placement is needed.   Cori Razor LCSW [phone]

## 2012-02-08 NOTE — Progress Notes (Signed)
Clinical Social Work Department CLINICAL SOCIAL WORK PLACEMENT NOTE 02/08/2012  Patient:  Colleen [name], Colleen [name]  Account Number:  [id number] Admit date:  02/07/2012  Clinical Social Worker:  Cori Razor, LCSW  Date/time:  02/08/2012 02:11 PM  Clinical Social Work is seeking post-discharge placement for this patient at the following level of care:   ASSISTED LIVING/REST HOME   (*CSW will update this form in Epic as items are completed)     Patient/family provided with [facility] Department of Clinical Social Work's list of facilities offering this level of care within the geographic area requested by the patient (or if unable, by the patient's family).    Patient/family informed of their freedom to choose among providers that offer the needed level of care, that participate in Medicare, Medicaid or managed care program needed by the patient, have an available bed and are willing to accept the patient.    Patient/family informed of [facility]' ownership interest in [facility], as well as of the fact that they are under no obligation to receive care at this facility.  PASARR submitted to EDS on 02/08/2012 PASARR number received from EDS on 02/08/2012  FL2 transmitted to all facilities in geographic area requested by pt/family on  02/08/2012 FL2 transmitted to all facilities within larger geographic area on   Patient informed that his/her managed care company has contracts with or will negotiate with  certain facilities, including the following:     Patient/family informed of bed offers received:   Patient chooses bed at  Physician recommends and patient chooses bed at    Patient to be transferred to  on   Patient to be transferred to facility by   The following physician request were entered in Epic:   Additional Comments:  Cori Razor LCSW [phone]

## 2012-02-09 DIAGNOSIS — C50919 Malignant neoplasm of unspecified site of unspecified female breast: Secondary | ICD-10-CM

## 2012-02-09 DIAGNOSIS — I10 Essential (primary) hypertension: Secondary | ICD-10-CM

## 2012-02-09 LAB — QUANTIFERON TB GOLD ASSAY (BLOOD): Interferon Gamma Release Assay: NEGATIVE

## 2012-02-09 MED ORDER — INFLUENZA VIRUS VACC SPLIT PF IM SUSP
0.5000 mL | Freq: Once | INTRAMUSCULAR | Status: AC
  Administered 2012-02-09: 0.5 mL via INTRAMUSCULAR
  Filled 2012-02-09: qty 0.5

## 2012-02-09 MED ORDER — PNEUMOCOCCAL VAC POLYVALENT 25 MCG/0.5ML IJ INJ
0.5000 mL | INJECTION | Freq: Once | INTRAMUSCULAR | Status: AC
  Administered 2012-02-09: 0.5 mL via INTRAMUSCULAR
  Filled 2012-02-09 (×2): qty 0.5

## 2012-02-09 MED ORDER — OXYCODONE HCL 5 MG PO TABS: 5.0000 mg | ORAL_TABLET | ORAL | Status: DC | PRN

## 2012-02-09 MED ORDER — PNEUMOCOCCAL VAC POLYVALENT 25 MCG/0.5ML IJ INJ: 0.5000 mL | INJECTION | INTRAMUSCULAR | Status: DC

## 2012-02-09 MED ORDER — DSS 100 MG PO CAPS: 100.0000 mg | ORAL_CAPSULE | Freq: Two times a day (BID) | ORAL | Status: DC

## 2012-02-09 NOTE — Progress Notes (Signed)
Physical Therapy Treatment Patient Details Name: Colleen [name] MRN: [id number] DOB: [birth year] Today's Date: 02/09/2012 Time: 6295-2841 PT Time Calculation (min): 20 min  PT Assessment / Plan / Recommendation Comments on Treatment Session       Follow Up Recommendations  Post acute inpatient rehab    Barriers to Discharge Inaccessible home environment      Equipment Recommendations   (defer to next venue of  care)    Recommendations for Other Services OT consult  Frequency 7X/week   Plan      Precautions / Restrictions Precautions Precautions: Fall Required Braces or Orthoses: Other Brace/Splint Other Brace/Splint: R cam boot Restrictions Weight Bearing Restrictions: Yes RLE Weight Bearing: Partial weight bearing RLE Partial Weight Bearing Percentage or Pounds: not indicated by physician   Pertinent Vitals/Pain     Mobility  Bed Mobility Bed Mobility: Sit to Supine Supine to Sit: 3: Mod assist Sit to Supine: 3: Mod assist Details for Bed Mobility Assistance: Assist with Bil LEs Transfers Transfers: Sit to Stand;Stand to Sit Sit to Stand: 1: +2 Total assist;From chair/3-in-1 Sit to Stand: Patient Percentage: 60% Stand to Sit: 1: +2 Total assist;To chair/3-in-1;To bed Stand to Sit: Patient Percentage: 60% Stand Pivot Transfers: 1: +2 Total assist Stand Pivot Transfers: Patient Percentage: 60% Details for Transfer Assistance: cues for use of UEs and sequence Ambulation/Gait Ambulation/Gait Assistance: 1: +2 Total assist Ambulation/Gait: Patient Percentage: 60% Ambulation Distance (Feet): 4 Feet Assistive device: Rolling walker Ambulation/Gait Assistance Details: cues for sequence, posture, position from RW; Multiple episodes of drift backward requiring assist to regain balance Gait Pattern: Step-to pattern General Gait Details: Pt tolerating ltd weight on R LE 2* pain    Exercises     PT Diagnosis: Difficulty walking;Generalized weakness  PT Problem List:  Decreased strength;Decreased activity tolerance;Decreased balance;Decreased mobility;Decreased knowledge of use of DME;Obesity;Pain PT Treatment Interventions: DME instruction;Gait training;Functional mobility training;Therapeutic exercise;Therapeutic activities;Neuromuscular re-education;Patient/family education;Wheelchair mobility training   PT Goals Acute Rehab PT Goals PT Goal Formulation: With patient Time For Goal Achievement: 02/13/12 Potential to Achieve Goals: Fair Pt will go Supine/Side to Sit: with min assist PT Goal: Supine/Side to Sit - Progress: Goal set today Pt will go Sit to Supine/Side: with min assist PT Goal: Sit to Supine/Side - Progress: Goal set today Pt will go Sit to Stand: with mod assist PT Goal: Sit to Stand - Progress: Goal set today Pt will go Stand to Sit: with mod assist PT Goal: Stand to Sit - Progress: Goal set today Pt will Transfer Bed to Chair/Chair to Bed: with mod assist PT Transfer Goal: Bed to Chair/Chair to Bed - Progress: Goal set today Pt will Ambulate: 16 - 50 feet;with mod assist;with rolling walker PT Goal: Ambulate - Progress: Goal set today  Visit Information  Last PT Received On: 02/09/12 Assistance Needed: +2    Subjective Data  Subjective: I was able to walk up to 13ft prior to this with a walker but it was torture and the ankle I broke was on my stronger leg Patient Stated Goal: Rehab and then to more appropriate living arrangement in ALF   Cognition  Overall Cognitive Status: Appears within functional limits for tasks assessed/performed Arousal/Alertness: Awake/alert Orientation Level: Appears intact for tasks assessed Behavior During Session: [facility] for tasks performed    Balance     End of Session PT - End of Session Equipment Utilized During Treatment: Gait belt;Other (comment) Activity Tolerance: Patient tolerated treatment well;Patient limited by fatigue Patient left: in chair;with call bell/phone  within reach Nurse  Communication: Mobility status   GP     Menna Abeln 02/09/2012, 3:08 PM

## 2012-02-09 NOTE — Progress Notes (Signed)
Clinical Social Work Department CLINICAL SOCIAL WORK PLACEMENT NOTE 02/09/2012  Patient:  Colleen [name], Colleen [name]  Account Number:  [id number] Admit date:  02/07/2012  Clinical Social Worker:  Cori Razor, LCSW  Date/time:  02/08/2012 02:11 PM  Clinical Social Work is seeking post-discharge placement for this patient at the following level of care:   ASSISTED LIVING/REST HOME   (*CSW will update this form in Epic as items are completed)     Patient/family provided with [facility] Department of Clinical Social Work's list of facilities offering this level of care within the geographic area requested by the patient (or if unable, by the patient's family).    Patient/family informed of their freedom to choose among providers that offer the needed level of care, that participate in Medicare, Medicaid or managed care program needed by the patient, have an available bed and are willing to accept the patient.    Patient/family informed of [facility]' ownership interest in [facility], as well as of the fact that they are under no obligation to receive care at this facility.  PASARR submitted to EDS on 02/08/2012 PASARR number received from EDS on 02/08/2012  FL2 transmitted to all facilities in geographic area requested by pt/family on  02/08/2012 FL2 transmitted to all facilities within larger geographic area on   Patient informed that his/her managed care company has contracts with or will negotiate with  certain facilities, including the following:     Patient/family informed of bed offers received:  02/09/2012 Patient chooses bed at [facility] Physician recommends and patient chooses bed at    Patient to be transferred to [facility] on  02/09/2012 Patient to be transferred to facility by [facility]  The following physician request were entered in Epic:   Additional Comments: [employer] provided prior approval for SNF placement.  Cori Razor LCSW [phone]

## 2012-02-09 NOTE — Evaluation (Signed)
Physical Therapy Evaluation Patient Details Name: Colleen [name] MRN: [id number] DOB: 1945-06-02 Today's Date: 02/09/2012 Time: 4098-1191 PT Time Calculation (min): 25 min  PT Assessment / Plan / Recommendation Clinical Impression  Pt s/p R ankle fx and with dx of MS presents with Generalized weakness (L side more affected) and PWB 2*ankle fx.  Pt is significantly limited in functional abilty and ability to perform basic self care.    PT Assessment  Patient needs continued PT services    Follow Up Recommendations  Post acute inpatient rehab    Barriers to Discharge Inaccessible home environment      Equipment Recommendations   (defer to next venue of  care)    Recommendations for Other Services OT consult   Frequency 7X/week    Precautions / Restrictions Precautions Precautions: Fall Required Braces or Orthoses: Other Brace/Splint Other Brace/Splint: R cam boot Restrictions Weight Bearing Restrictions: Yes RLE Weight Bearing: Partial weight bearing RLE Partial Weight Bearing Percentage or Pounds: not indicated by physician   Pertinent Vitals/Pain       Mobility  Bed Mobility Bed Mobility: Supine to Sit Supine to Sit: 3: Mod assist Details for Bed Mobility Assistance: Assist with Bil LEs Transfers Transfers: Sit to Stand;Stand to Sit Sit to Stand: 1: +2 Total assist;From elevated surface;From bed;From chair/3-in-1 Sit to Stand: Patient Percentage: 60% Stand to Sit: 1: +2 Total assist;To chair/3-in-1 Stand to Sit: Patient Percentage: 60% Stand Pivot Transfers: 1: +2 Total assist (Bed to [facility] with RW) Stand Pivot Transfers: Patient Percentage: 60% Details for Transfer Assistance: cues for use of UEs and sequence Ambulation/Gait Ambulation/Gait Assistance: 1: +2 Total assist Ambulation/Gait: Patient Percentage: 60% Ambulation Distance (Feet): 5 Feet Assistive device: Rolling walker Ambulation/Gait Assistance Details: Cues for posture, position from RW and  sequence Gait Pattern: Step-to pattern General Gait Details: Pt tolerating ltd weight on R LE 2* pain    Shoulder Instructions     Exercises     PT Diagnosis: Difficulty walking;Generalized weakness  PT Problem List: Decreased strength;Decreased activity tolerance;Decreased balance;Decreased mobility;Decreased knowledge of use of DME;Obesity;Pain PT Treatment Interventions: DME instruction;Gait training;Functional mobility training;Therapeutic exercise;Therapeutic activities;Neuromuscular re-education;Patient/family education;Wheelchair mobility training   PT Goals Acute Rehab PT Goals PT Goal Formulation: With patient Time For Goal Achievement: 02/13/12 Potential to Achieve Goals: Fair Pt will go Supine/Side to Sit: with min assist PT Goal: Supine/Side to Sit - Progress: Goal set today Pt will go Sit to Supine/Side: with min assist PT Goal: Sit to Supine/Side - Progress: Goal set today Pt will go Sit to Stand: with mod assist PT Goal: Sit to Stand - Progress: Goal set today Pt will go Stand to Sit: with mod assist PT Goal: Stand to Sit - Progress: Goal set today Pt will Transfer Bed to Chair/Chair to Bed: with mod assist PT Transfer Goal: Bed to Chair/Chair to Bed - Progress: Goal set today Pt will Ambulate: 16 - 50 feet;with mod assist;with rolling walker PT Goal: Ambulate - Progress: Goal set today  Visit Information  Last PT Received On: 02/09/12 Assistance Needed: +2    Subjective Data  Subjective: I was able to walk up to 87ft prior to this with a walker but it was torture and the ankle I broke was on my stronger leg Patient Stated Goal: Rehab and then to more appropriate living arrangement in ALF   Prior Functioning  Home Living Lives With: Spouse Available Help at Discharge: Family Type of Home: House Home Access: Stairs to enter Prior Function Level of  Independence: Needs assistance Able to Take Stairs?: Yes Driving: Yes Vocation: Full time  employment Communication Communication: No difficulties Dominant Hand: Right    Cognition  Overall Cognitive Status: Appears within functional limits for tasks assessed/performed Arousal/Alertness: Awake/alert Orientation Level: Appears intact for tasks assessed Behavior During Session: [facility] for tasks performed    Extremity/Trunk Assessment Right Upper Extremity Assessment RUE ROM/Strength/Tone: Deficits RUE ROM/Strength/Tone Deficits: ROM WFL with strength ~ 3+/5 Left Upper Extremity Assessment LUE ROM/Strength/Tone: Deficits LUE ROM/Strength/Tone Deficits: ROM WFL with strength 3-/5 Right Lower Extremity Assessment RLE ROM/Strength/Tone: Deficits RLE ROM/Strength/Tone Deficits: ROM WFL except ankle; strength 4/5  Left Lower Extremity Assessment LLE ROM/Strength/Tone: Deficits LLE ROM/Strength/Tone Deficits: ROM WFL with strength 3+/5   Balance    End of Session PT - End of Session Equipment Utilized During Treatment: Gait belt;Other (comment) (R Cam boot) Activity Tolerance: Patient tolerated treatment well;Patient limited by fatigue Patient left: in chair;with call bell/phone within reach Nurse Communication: Mobility status  GP     Colleen [name] 02/09/2012, 12:29 PM

## 2012-02-09 NOTE — Progress Notes (Signed)
Patient said clearly that she does not want spiritual support. However, she happened to mention that she had been to the [place] and that opened a door for [place] to talk. She then shared the grief at "losing 25 years of her life" to MS. Listening; support.  02/09/12 1400  Clinical Encounter Type  Visited With Patient  Visit Type Social support  Referral From Nurse  Spiritual Encounters  Spiritual Needs Emotional

## 2012-02-09 NOTE — Progress Notes (Addendum)
Pt for d/c to SNF- [facility] today. Weakness to bil.LE persists r/t HX MS. Cam boot to R LE applied when assisted up. Cam walker used as well. Continues to require 2 person assist when up. Discharge instructions discussed with pt &  with verbalized understanding of it. No changes in am assessments today. [facility] transported pt to SNF. Family aware of d/c.

## 2012-02-09 NOTE — Discharge Summary (Signed)
Physician Discharge Summary  Colleen [name] ZOX:[id number] DOB: 02/21/1946 DOA: 02/07/2012  PCP: Ginette Otto, MD  Admit date: 02/07/2012 Discharge date: 02/09/2012  Recommendations for Outpatient Follow-up:  1. Please follow up results of thyroid biopsy done 02/07/2012 2. F/U with ortho in their office 02/13/2012 per scheduled appointment  Discharge Diagnoses:  Principal Problem:  *Fracture, fibula Active Problems:  Multiple sclerosis  Hypothyroidism  Breast cancer  Hypertension    Discharge Condition: medically stable for discharge to SNF today  Diet recommendation: as tolerated  History of present illness: 66 year old female with history of breast cancer, multiple sclerosis presented with right fibula fracture status post fall status post fall.   Assessment/Plan:   Principal Problem:  *Fracture, fibula  - Appreciate orthopedics consult - per recommendation [facility] and to follow up with their office Tuesday, 10//12/2011 per scheduled appontment - Continue current pain regimen with oxycodone 5 mg every 4 hours as needed for moderate pain  - analgesia with oxycodone PRN  Active Problems:  Hypothyroidism  - Continue Synthroid 100 mcg daily  - follow up FNA of bilateral thyroid done 02/07/2012 Hypertension  - Blood pressure at goal  - Daily HCTZ 25 mg daily and losartan 50 mg daily  Breast cancer  - Continue anastrozole 1 mg daily   Code Status: full code  Family Communication:  family at bedside  Disposition Plan: to SNF today  Manson Passey, MD  [facility]  Pager [phone]   Consultants:  Ortho  Procedures:  None Antibiotics:  None  Discharge Exam: Filed Vitals:   02/09/12 0454  BP: 141/81  Pulse: 86  Temp: 98.9 F (37.2 C)  Resp: 16   Filed Vitals:   02/08/12 0619 02/08/12 1434 02/08/12 2125 02/09/12 0454  BP: 125/79 130/88 105/62 141/81  Pulse: 85 97 85 86  Temp: 98.5 F (36.9 C) 98.9 F (37.2 C) 98.7 F (37.1 C) 98.9 F (37.2 C)  TempSrc: Oral  Oral Oral Oral  Resp: 20 20 20 16   Height:      Weight:      SpO2: 98% 98% 96% 96%    General: Pt is alert, follows commands appropriately, not in acute distress; morbid obesity Cardiovascular: Regular rate and rhythm, S1/S2 +, no murmurs, no rubs, no gallops Respiratory: Clear to auscultation bilaterally, no wheezing, no crackles, no rhonchi Abdominal: Soft, non tender, non distended, bowel sounds +, no guarding Extremities: no edema, no cyanosis, pulses palpable bilaterally DP and PT Neuro: Grossly nonfocal  Discharge Instructions  Discharge Orders    Future Appointments: Provider: Department: Dept Phone: Center:   03/15/2012 11:00 AM Randall An, MD [facility] [phone] None     Future Orders Please Complete By Expires   Diet - low sodium heart healthy      Increase activity slowly      Call MD for:  persistant nausea and vomiting      Call MD for:  severe uncontrolled pain      Call MD for:  difficulty breathing, headache or visual disturbances      Call MD for:  persistant dizziness or light-headedness          Medication List     As of 02/09/2012 10:36 AM    TAKE these medications         anastrozole 1 MG tablet   Commonly known as: ARIMIDEX   Take 1 mg by mouth daily.      aspirin 81 MG tablet   Take 81 mg by  mouth daily.      cholecalciferol 1000 UNITS tablet   Commonly known as: VITAMIN D   Take 1,000 Units by mouth daily.      cyanocobalamin 1000 MCG tablet   Take 100 mcg by mouth daily.      DSS 100 MG Caps   Take 100 mg by mouth 2 (two) times daily.      hydrochlorothiazide 25 MG tablet   Commonly known as: HYDRODIURIL   Take 25 mg by mouth daily.      levothyroxine 100 MCG tablet   Commonly known as: SYNTHROID, LEVOTHROID   Take 100 mcg by mouth daily.      losartan 50 MG tablet   Commonly known as: COZAAR   Take 50 mg by mouth daily.      multivitamin, stress formula tablet   Take 1 tablet by mouth daily.      oxyCODONE 5  MG immediate release tablet   Commonly known as: Oxy IR/ROXICODONE   Take 1 tablet (5 mg total) by mouth every 4 (four) hours as needed for pain.           Follow-up Information    Follow up with Raymon Mutton, MD. On 02/13/2012. (wbat in boot)    Contact information:   [address] [place] [address] [phone]           The results of significant diagnostics from this hospitalization (including imaging, microbiology, ancillary and laboratory) are listed below for reference.    Significant Diagnostic Studies: Dg Ankle Complete Right  02/07/2012  *RADIOLOGY REPORT*  Clinical Data: Ankle injury  RIGHT ANKLE - COMPLETE 3+ VIEW  Comparison: None.  Findings: Three views of the right ankle submitted.  Ankle mortise is preserved.  There is a subtle lucent line at the tip of distal fibula with some cortical irregularity suspicious for a subtle nondisplaced fracture.  Clinical correlation is necessary.  There is adjacent soft tissue swelling.  IMPRESSION: Subtle lucent line at the tip of distal fibula with some cortical irregularity suspicious for a subtle nondisplaced fracture. Clinical correlation is necessary.  There is adjacent soft tissue swelling.   Original Report Authenticated By: Natasha Mead, M.D.    US Thyroid Biopsy  02/07/2012  *RADIOLOGY REPORT*  Indication: Indeterminate bilateral thyroid nodules  ULTRASOUND GUIDED THYROID FINE NEEDLE ASPIRATION  Comparisons: Thyroid Ultrasound - 01/25/2011  Intravenous Medications: None  Complications: None immediate  Technique / Findings:  Informed written consent was obtained from the patient after a discussion of the risks, benefits and alternatives to treatment. Questions regarding the procedure were encouraged and answered.  A timeout was performed prior to the initiation of the procedure.  Pre-procedural ultrasound scanning demonstrated possible interval erosive and dominant right-sided thyroid nodule extension with extension into  the thyroid isthmus, now measuring approximately 5.3 cm in greatest transverse dimension (image 3) previously, 4.9 cm. The previously described partially cystic, primarily solid nodule within the left lobe of the thyroid appears to contain more solid component and also appears increased in size, measuring approximately 2.4 cm in greatest transverse dimension (image 5) previously, 2.2 cm.  The procedure was planned.  The neck was prepped in the usual sterile fashion, and a sterile drape was applied covering the operative field.  A timeout was performed prior to the initiation of the procedure.  Local anesthesia was provided with 1% lidocaine.  Under direct ultrasound guidance, 3 FNA biopsies were performed of the dominant right sided nodule with a 25 gauge needle.  The samples were prepared and submitted to pathology.  Limited post procedural scanning was negative for hematoma or additional complication.  Under direct ultrasound guidance, three FNA biopsies were performed of the dominant left-sided nodule with a 25 gauge needle.  The samples were prepared and submitted to pathology.  Limited post procedure scanning was negative for hematoma or additional complication.  Dressings were placed.  The patient tolerated the above procedures well without immediate postprocedural complication.  Impression:  Technically successful ultrasound guided fine needle aspiration of dominant bilateral thyroid nodules, both of which have increased in size since most recent diagnostic thyroid ultrasound performed in 01/2011.   Original Report Authenticated By: Waynard Reeds, M.D.    US Thyroid Biopsy  02/07/2012  *RADIOLOGY REPORT*  Indication: Indeterminate bilateral thyroid nodules  ULTRASOUND GUIDED THYROID FINE NEEDLE ASPIRATION  Comparisons: Thyroid Ultrasound - 01/25/2011  Intravenous Medications: None  Complications: None immediate  Technique / Findings:  Informed written consent was obtained from the patient after a  discussion of the risks, benefits and alternatives to treatment. Questions regarding the procedure were encouraged and answered.  A timeout was performed prior to the initiation of the procedure.  Pre-procedural ultrasound scanning demonstrated possible interval erosive and dominant right-sided thyroid nodule extension with extension into the thyroid isthmus, now measuring approximately 5.3 cm in greatest transverse dimension (image 3) previously, 4.9 cm. The previously described partially cystic, primarily solid nodule within the left lobe of the thyroid appears to contain more solid component and also appears increased in size, measuring approximately 2.4 cm in greatest transverse dimension (image 5) previously, 2.2 cm.  The procedure was planned.  The neck was prepped in the usual sterile fashion, and a sterile drape was applied covering the operative field.  A timeout was performed prior to the initiation of the procedure.  Local anesthesia was provided with 1% lidocaine.  Under direct ultrasound guidance, 3 FNA biopsies were performed of the dominant right sided nodule with a 25 gauge needle.  The samples were prepared and submitted to pathology.  Limited post procedural scanning was negative for hematoma or additional complication.  Under direct ultrasound guidance, three FNA biopsies were performed of the dominant left-sided nodule with a 25 gauge needle.  The samples were prepared and submitted to pathology.  Limited post procedure scanning was negative for hematoma or additional complication.  Dressings were placed.  The patient tolerated the above procedures well without immediate postprocedural complication.  Impression:  Technically successful ultrasound guided fine needle aspiration of dominant bilateral thyroid nodules, both of which have increased in size since most recent diagnostic thyroid ultrasound performed in 01/2011.   Original Report Authenticated By: Waynard Reeds, M.D.      Microbiology: No results found for this or any previous visit (from the past 240 hour(s)).   Labs: Basic Metabolic Panel:  Lab 02/07/12 1610 02/07/12 1935  NA -- 138  K -- 3.8  CL -- 104  CO2 -- --  GLUCOSE -- 118*  BUN -- 19  CREATININE 0.83 1.00  CALCIUM -- --  MG -- --  PHOS -- --   Liver Function Tests: No results found for this basename: AST:5,ALT:5,ALKPHOS:5,BILITOT:5,PROT:5,ALBUMIN:5 in the last 168 hours No results found for this basename: LIPASE:5,AMYLASE:5 in the last 168 hours No results found for this basename: AMMONIA:5 in the last 168 hours CBC:  Lab 02/07/12 2030 02/07/12 1935  WBC 12.9* --  NEUTROABS -- --  HGB 15.3* 15.3*  HCT 41.7 45.0  MCV  80.8 --  PLT 364 --   Cardiac Enzymes: No results found for this basename: CKTOTAL:5,CKMB:5,CKMBINDEX:5,TROPONINI:5 in the last 168 hours BNP: BNP (last 3 results) No results found for this basename: PROBNP:3 in the last 8760 hours CBG: No results found for this basename: GLUCAP:5 in the last 168 hours  Time coordinating discharge: Over 30 minutes  Signed:  Manson Passey, MD  TR 02/09/2012, 10:36 AM  Pager #: [phone]

## 2012-02-12 NOTE — Progress Notes (Addendum)
02/09/12 1133  PT G-Codes **NOT FOR INPATIENT CLASS**  Functional Assessment Tool Used clincial judgement based upon chart review from Eval performed by Mauro Kaufmann  Functional Limitation Mobility: Walking and moving around  Mobility: Walking and Moving Around Current Status [id number]) CK  Mobility: Walking and Moving Around Goal Status (U0454) CK  PT General Charges  $$ ACUTE PT VISIT 1 Procedure  PT Evaluation  $Initial PT Evaluation Tier I 1 Procedure  PT Treatments  $Therapeutic Activity 8-22 mins    G-codes added for assessment performed by Mauro Kaufmann, PT on 02/09/2012.

## 2012-03-15 ENCOUNTER — Ambulatory Visit (HOSPITAL_COMMUNITY): Payer: 59 | Admitting: Oncology

## 2012-04-22 ENCOUNTER — Encounter (HOSPITAL_COMMUNITY): Payer: Self-pay | Admitting: Oncology

## 2012-04-22 ENCOUNTER — Encounter (HOSPITAL_COMMUNITY): Payer: 59 | Attending: Oncology | Admitting: Oncology

## 2012-04-22 VITALS — BP 119/70 | HR 86 | Temp 98.0°F | Resp 20 | Wt 221.0 lb

## 2012-04-22 DIAGNOSIS — S82409A Unspecified fracture of shaft of unspecified fibula, initial encounter for closed fracture: Secondary | ICD-10-CM

## 2012-04-22 DIAGNOSIS — I10 Essential (primary) hypertension: Secondary | ICD-10-CM

## 2012-04-22 DIAGNOSIS — G35 Multiple sclerosis: Secondary | ICD-10-CM

## 2012-04-22 DIAGNOSIS — C50919 Malignant neoplasm of unspecified site of unspecified female breast: Secondary | ICD-10-CM

## 2012-04-22 MED ORDER — ALPRAZOLAM 0.25 MG PO TABS: ORAL_TABLET | ORAL | Status: DC

## 2012-04-22 MED ORDER — ZOLPIDEM TARTRATE 5 MG PO TABS: 5.0000 mg | ORAL_TABLET | Freq: Every evening | ORAL | Status: DC | PRN

## 2012-04-22 NOTE — Patient Instructions (Addendum)
[facility] Discharge Instructions  RECOMMENDATIONS MADE BY THE CONSULTANT AND ANY TEST RESULTS WILL BE SENT TO YOUR REFERRING PHYSICIAN.  EXAM FINDINGS BY THE PHYSICIAN TODAY AND SIGNS OR SYMPTOMS TO REPORT TO CLINIC OR PRIMARY PHYSICIAN: Exam and discussion by MD.  Will stop the arimidex.  You look great.  Will get bone density done in January.  MEDICATIONS PRESCRIBED:  none  INSTRUCTIONS GIVEN AND DISCUSSED: Report any new lumps, bone pain or shortness of breath.  SPECIAL INSTRUCTIONS/FOLLOW-UP: Bone density in January and to see MD in 6 months.  Thank you for choosing [facility] to provide your oncology and hematology care.  To afford each patient quality time with our providers, please arrive at least 15 minutes before your scheduled appointment time.  With your help, our goal is to use those 15 minutes to complete the necessary work-up to ensure our physicians have the information they need to help with your evaluation and healthcare recommendations.    Effective January 1st, 2014, we ask that you re-schedule your appointment with our physicians should you arrive 10 or more minutes late for your appointment.  We strive to give you quality time with our providers, and arriving late affects you and other patients whose appointments are after yours.    Again, thank you for choosing [facility].  Our hope is that these requests will decrease the amount of time that you wait before being seen by our physicians.       _____________________________________________________________  I acknowledge that I have been informed and understand all the instructions given to me and received a copy. I do not have anymore questions at this time but understand that I may call the Cancer Center at [facility] at [phone] during business hours should I have any further questions or need assistance in obtaining follow-up  care.    __________________________________________  _____________  __________ Signature of Patient or Authorized Representative            Date                   Time    __________________________________________ Nurse's Signature

## 2012-04-22 NOTE — Progress Notes (Signed)
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 We then placed her on anastrozole and she will finish 5 full years as of this month. She has noticed recurrent disease. Problem #2 acute progressive multiple sclerosis status post right fibular fracture this year now living in an assisted care situation with nice improvement. She is in a wheelchair but very functional and getting around better actually. She is doing physical therapy daily. Problem #3 obesity Problem #4 hypertension Problem #5 goiter but on Synthroid replacement with her goiter easily palpable still. Problem #26multiple benign breast biopsies starting in her 21s Problem #7 bilateral cataract operations in the past Problem #8 cervical conization for atypia 1973 She is here today with her husband in a wheelchair but she is doing well. She can help [place] get her onto the exam table. She is worried about cessation of the Arimidex. I informed her that we don't have the ability to guarantee that 5 more years of therapy with this drug will benefit her. I think that information we'll, in the future but it is going to be scant information with a stage IB cancer of the breast.  Bowels are working well. She's not had a bone density in a couple years. We will set that up.  Vital signs are otherwise stable. Lymph nodes remain negative and the cervical, supraclavicular, infraclavicular, axillary or inguinal areas. She has no obvious hepatosplenomegaly. Skin exam over both chest walls is clear. Heart shows a regular rhythm and rate without murmur rub or gallop. Lungs are clear. She has no peripheral edema.  We will see her in 6 months but she is willing to stop  the Arimidex and we'll see if she feels any better off the drug. If there is any question she'll call us. I do not think there is a need for me to do blood work at this time.

## 2012-04-26 ENCOUNTER — Telehealth (HOSPITAL_COMMUNITY): Payer: Self-pay

## 2012-04-26 NOTE — Telephone Encounter (Signed)
Call from Darl Pikes - wanted to let Dr. Mariel Sleet know that her orthopedist had her call Dr. Pete Glatter and he is changing her fluid pill to Lasix.  Wanted let Dr. Mariel Sleet know that she will be taking that instead of taking the Hydrodiuril twice daily.

## 2012-06-03 ENCOUNTER — Telehealth (HOSPITAL_COMMUNITY): Payer: Self-pay

## 2012-06-03 NOTE — Telephone Encounter (Signed)
Patient notified that she needs to take at least 600 mg of calcium daily and 2000 units of vitamin D daily.  Verbalized understanding of instructions.

## 2012-06-13 ENCOUNTER — Encounter: Payer: Self-pay | Admitting: Oncology

## 2012-06-23 ENCOUNTER — Encounter (HOSPITAL_COMMUNITY): Payer: Self-pay | Admitting: Oncology

## 2012-10-21 ENCOUNTER — Ambulatory Visit (HOSPITAL_COMMUNITY): Payer: 59

## 2012-11-06 ENCOUNTER — Telehealth (HOSPITAL_COMMUNITY): Payer: Self-pay

## 2012-11-06 NOTE — Telephone Encounter (Signed)
Notified patient Dr. Mariel Sleet no longer practicing at [facility].  Per request of patient appointment for follow-up on 7/7 was cancelled.  She plans to talk with PCP to see if she needs to continue f/u with Medical Oncologist.  If needed, she plans to go to someone in [place] for care.  To call us if any additional assistance is needed with appointments.

## 2012-11-11 ENCOUNTER — Ambulatory Visit (HOSPITAL_COMMUNITY): Payer: 59

## 2012-11-18 ENCOUNTER — Other Ambulatory Visit: Payer: Self-pay | Admitting: Dermatology

## 2012-12-03 ENCOUNTER — Other Ambulatory Visit (HOSPITAL_COMMUNITY): Payer: Self-pay | Admitting: Geriatric Medicine

## 2012-12-03 DIAGNOSIS — M7989 Other specified soft tissue disorders: Secondary | ICD-10-CM

## 2012-12-03 DIAGNOSIS — R609 Edema, unspecified: Secondary | ICD-10-CM

## 2012-12-04 ENCOUNTER — Ambulatory Visit (HOSPITAL_COMMUNITY)
Admission: RE | Admit: 2012-12-04 | Discharge: 2012-12-04 | Disposition: A | Discharge status: Home / Self Care | Payer: 59 | Source: Ambulatory Visit | Attending: Geriatric Medicine | Admitting: Geriatric Medicine

## 2012-12-04 DIAGNOSIS — R609 Edema, unspecified: Secondary | ICD-10-CM

## 2012-12-04 DIAGNOSIS — G35 Multiple sclerosis: Secondary | ICD-10-CM

## 2012-12-04 DIAGNOSIS — G35D Multiple sclerosis, unspecified: Secondary | ICD-10-CM

## 2012-12-04 DIAGNOSIS — M7989 Other specified soft tissue disorders: Secondary | ICD-10-CM | POA: Insufficient documentation

## 2012-12-04 NOTE — Progress Notes (Signed)
VASCULAR LAB PRELIMINARY  PRELIMINARY  PRELIMINARY  PRELIMINARY  Left lower extremity venous duplex completed.    Preliminary report:  Left:  No evidence of DVT, superficial thrombosis, or Baker's cyst.  Meade [place], RVT 12/04/2012, 2:19 PM

## 2013-03-13 ENCOUNTER — Other Ambulatory Visit: Payer: Self-pay

## 2014-01-07 ENCOUNTER — Other Ambulatory Visit: Payer: Self-pay | Admitting: Internal Medicine

## 2014-01-08 ENCOUNTER — Other Ambulatory Visit: Payer: Self-pay | Admitting: Internal Medicine

## 2014-01-08 DIAGNOSIS — E049 Nontoxic goiter, unspecified: Secondary | ICD-10-CM

## 2014-01-20 ENCOUNTER — Ambulatory Visit
Admission: RE | Admit: 2014-01-20 | Discharge: 2014-01-20 | Disposition: A | Discharge status: Home / Self Care | Payer: 59 | Source: Ambulatory Visit | Attending: Internal Medicine | Admitting: Internal Medicine

## 2014-01-20 DIAGNOSIS — E049 Nontoxic goiter, unspecified: Secondary | ICD-10-CM

## 2014-01-29 ENCOUNTER — Other Ambulatory Visit: Payer: Self-pay | Admitting: Internal Medicine

## 2014-01-29 DIAGNOSIS — E042 Nontoxic multinodular goiter: Secondary | ICD-10-CM

## 2014-02-11 ENCOUNTER — Other Ambulatory Visit (HOSPITAL_COMMUNITY)
Admission: RE | Admit: 2014-02-11 | Discharge: 2014-02-11 | Disposition: A | Discharge status: Home / Self Care | Payer: 59 | Source: Ambulatory Visit | Attending: Interventional Radiology | Admitting: Interventional Radiology

## 2014-02-11 ENCOUNTER — Ambulatory Visit
Admission: RE | Admit: 2014-02-11 | Discharge: 2014-02-11 | Disposition: A | Discharge status: Home / Self Care | Payer: 59 | Source: Ambulatory Visit | Attending: Internal Medicine | Admitting: Internal Medicine

## 2014-02-11 DIAGNOSIS — E042 Nontoxic multinodular goiter: Secondary | ICD-10-CM

## 2014-02-11 DIAGNOSIS — E041 Nontoxic single thyroid nodule: Secondary | ICD-10-CM | POA: Insufficient documentation

## 2014-03-09 ENCOUNTER — Ambulatory Visit (INDEPENDENT_AMBULATORY_CARE_PROVIDER_SITE_OTHER): Payer: Self-pay | Admitting: Surgery

## 2014-04-21 ENCOUNTER — Encounter (HOSPITAL_COMMUNITY): Payer: Self-pay

## 2014-04-21 ENCOUNTER — Encounter (HOSPITAL_COMMUNITY)
Admission: RE | Admit: 2014-04-21 | Discharge: 2014-04-21 | Disposition: A | Discharge status: Home / Self Care | Payer: 59 | Source: Ambulatory Visit | Attending: Surgery | Admitting: Surgery

## 2014-04-21 ENCOUNTER — Ambulatory Visit (HOSPITAL_COMMUNITY)
Admission: RE | Admit: 2014-04-21 | Discharge: 2014-04-21 | Disposition: A | Discharge status: Home / Self Care | Payer: 59 | Source: Ambulatory Visit | Attending: Anesthesiology | Admitting: Anesthesiology

## 2014-04-21 DIAGNOSIS — Z01818 Encounter for other preprocedural examination: Secondary | ICD-10-CM | POA: Diagnosis present

## 2014-04-21 DIAGNOSIS — D497 Neoplasm of unspecified behavior of endocrine glands and other parts of nervous system: Secondary | ICD-10-CM | POA: Diagnosis not present

## 2014-04-21 HISTORY — DX: Nontoxic single thyroid nodule: E04.1

## 2014-04-21 LAB — CBC
HEMATOCRIT: 42.5 % (ref 36.0–46.0)
HEMOGLOBIN: 14.7 g/dL (ref 12.0–15.0)
MCH: 29.6 pg (ref 26.0–34.0)
MCHC: 34.6 g/dL (ref 30.0–36.0)
MCV: 85.7 fL (ref 78.0–100.0)
Platelets: 287 10*3/uL (ref 150–400)
RBC: 4.96 MIL/uL (ref 3.87–5.11)
RDW: 13.8 % (ref 11.5–15.5)
WBC: 7.2 10*3/uL (ref 4.0–10.5)

## 2014-04-21 LAB — BASIC METABOLIC PANEL
Anion gap: 14 (ref 5–15)
BUN: 18 mg/dL (ref 6–23)
CALCIUM: 9.4 mg/dL (ref 8.4–10.5)
CO2: 24 meq/L (ref 19–32)
CREATININE: 0.96 mg/dL (ref 0.50–1.10)
Chloride: 98 mEq/L (ref 96–112)
GFR calc Af Amer: 69 mL/min — ABNORMAL LOW (ref >90)
GFR calc non Af Amer: 60 mL/min — ABNORMAL LOW (ref >90)
GLUCOSE: 122 mg/dL — AB (ref 70–99)
Potassium: 3.7 mEq/L (ref 3.7–5.3)
Sodium: 136 mEq/L — ABNORMAL LOW (ref 137–147)

## 2014-04-21 NOTE — Patient Instructions (Signed)
Colleen [name]  04/21/2014   Your procedure is scheduled on:   Thursday  December 17th  Report to [facility] at 10:00 AM.  Call this number if you have problems the morning of surgery [phone]   Remember:  Do not eat food or drink liquids :After Midnight.     Take these medicines the morning of surgery with A SIP OF WATER: SYNTHROID                               You may not have any metal on your body including hair pins and              piercings  Do not wear jewelry, make-up, lotions, powders or perfumes.             Do not wear nail polish.  Do not shave  48 hours prior to surgery.              Men may shave face and neck.   Do not bring valuables to the hospital. Cross.  Contacts, dentures or bridgework may not be worn into surgery.  Leave suitcase in the car. After surgery it may be brought to your room.     Patients discharged the day of surgery will not be allowed to drive home.  Name and phone number of your driver:  N/A - PATIENT TO STAY ONE NIGHT  Special Instructions: N/A              Please read over the following fact sheets you were given: _____________________________________________________________________             [facility] - Preparing for Surgery Before surgery, you can play an important role.  Because skin is not sterile, your skin needs to be as free of germs as possible.  You can reduce the number of germs on your skin by washing with CHG (chlorahexidine gluconate) soap before surgery.  CHG is an antiseptic cleaner which kills germs and bonds with the skin to continue killing germs even after washing. Please DO NOT use if you have an allergy to CHG or antibacterial soaps.  If your skin becomes reddened/irritated stop using the CHG and inform your nurse when you arrive at Short Stay. Do not shave (including legs and underarms) for at least 48  hours prior to the first CHG shower.  You may shave your face/neck. Please follow these instructions carefully:  1.  Shower with CHG Soap the night before surgery and the  morning of Surgery.  2.  If you choose to wash your hair, wash your hair first as usual with your  normal  shampoo.  3.  After you shampoo, rinse your hair and body thoroughly to remove the  shampoo.                           4.  Use CHG as you would any other liquid soap.  You can apply chg directly  to the skin and wash  Gently with a scrungie or clean washcloth.  5.  Apply the CHG Soap to your body ONLY FROM THE NECK DOWN.   Do not use on face/ open                           Wound or open sores. Avoid contact with eyes, ears mouth and genitals (private parts).                       Wash face,  Genitals (private parts) with your normal soap.             6.  Wash thoroughly, paying special attention to the area where your surgery  will be performed.  7.  Thoroughly rinse your body with warm water from the neck down.  8.  DO NOT shower/wash with your normal soap after using and rinsing off  the CHG Soap.                9.  Pat yourself dry with a clean towel.            10.  Wear clean pajamas.            11.  Place clean sheets on your bed the night of your first shower and do not  sleep with pets. Day of Surgery : Do not apply any lotions/deodorants the morning of surgery.  Please wear clean clothes to the hospital/surgery center.  FAILURE TO FOLLOW THESE INSTRUCTIONS MAY RESULT IN THE CANCELLATION OF YOUR SURGERY PATIENT SIGNATURE_________________________________  NURSE SIGNATURE__________________________________  ________________________________________________________________________ [facility] - Preparing for Surgery Before surgery, you can play an important role.  Because skin is not sterile, your skin needs to be as free of germs as possible.  You can reduce the number of germs on your skin by  washing with CHG (chlorahexidine gluconate) soap before surgery.  CHG is an antiseptic cleaner which kills germs and bonds with the skin to continue killing germs even after washing. Please DO NOT use if you have an allergy to CHG or antibacterial soaps.  If your skin becomes reddened/irritated stop using the CHG and inform your nurse when you arrive at Short Stay. Do not shave (including legs and underarms) for at least 48 hours prior to the first CHG shower.  You may shave your face/neck. Please follow these instructions carefully:  1.  Shower with CHG Soap the night before surgery and the  morning of Surgery.  2.  If you choose to wash your hair, wash your hair first as usual with your  normal  shampoo.  3.  After you shampoo, rinse your hair and body thoroughly to remove the  shampoo.                           4.  Use CHG as you would any other liquid soap.  You can apply chg directly  to the skin and wash                       Gently with a scrungie or clean washcloth.  5.  Apply the CHG Soap to your body ONLY FROM THE NECK DOWN.   Do not use on face/ open                           Wound or  open sores. Avoid contact with eyes, ears mouth and genitals (private parts).                       Wash face,  Genitals (private parts) with your normal soap.             6.  Wash thoroughly, paying special attention to the area where your surgery  will be performed.  7.  Thoroughly rinse your body with warm water from the neck down.  8.  DO NOT shower/wash with your normal soap after using and rinsing off  the CHG Soap.                9.  Pat yourself dry with a clean towel.            10.  Wear clean pajamas.            11.  Place clean sheets on your bed the night of your first shower and do not  sleep with pets. Day of Surgery : Do not apply any lotions/deodorants the morning of surgery.  Please wear clean clothes to the hospital/surgery center.  FAILURE TO FOLLOW THESE INSTRUCTIONS MAY RESULT IN THE  CANCELLATION OF YOUR SURGERY PATIENT SIGNATURE_________________________________  NURSE SIGNATURE__________________________________  ________________________________________________________________________

## 2014-04-21 NOTE — Progress Notes (Signed)
Quick Note:  These results are acceptable for scheduled surgery.  Ruthella Kirchman M. Latandra Loureiro, MD, FACS [facility] Office: [phone]   ______ 

## 2014-04-21 NOTE — Anesthesia Preprocedure Evaluation (Addendum)
Anesthesia Evaluation  Patient identified by MRN, date of birth, ID band Patient awake  General Assessment Comment:Reports no problems with anesthesia in the past but has not had an anesthetic since diagnosis of MS in 2010  Reviewed: Allergy & Precautions, H&P , NPO status , Patient's Chart, lab work & pertinent test results  History of Anesthesia Complications Negative for: history of anesthetic complications  Airway Mallampati: II  TM Distance: >3 FB Neck ROM: Full    Dental no notable dental hx. (+) Dental Advisory Given   Pulmonary former smoker,  breath sounds clear to auscultation  Pulmonary exam normal       Cardiovascular hypertension, Pt. on medications Rhythm:Regular Rate:Normal     Neuro/Psych Diagnosed in 2010 with progressive MS and has been stable according to her last neurology note from [facility]. No recent changes to her management or drastic decline in her health  Neuromuscular disease negative psych ROS   GI/Hepatic negative GI ROS, Neg liver ROS,   Endo/Other  Hypothyroidism Morbid obesity  Renal/GU negative Renal ROS  negative genitourinary   Musculoskeletal negative musculoskeletal ROS (+)   Abdominal (+) + obese,   Peds negative pediatric ROS (+)  Hematology negative hematology ROS (+)   Anesthesia Other Findings   Reproductive/Obstetrics negative OB ROS                          Anesthesia Physical Anesthesia Plan  ASA: III  Anesthesia Plan: General   Post-op Pain Management:    Induction: Intravenous  Airway Management Planned: Oral ETT  Additional Equipment:   Intra-op Plan:   Post-operative Plan: Extubation in OR  Informed Consent: I have reviewed the patients History and Physical, chart, labs and discussed the procedure including the risks, benefits and alternatives for the proposed anesthesia with the patient or authorized representative who has  indicated his/her understanding and acceptance.   Dental advisory given  Plan Discussed with: CRNA  Anesthesia Plan Comments:         Anesthesia Quick Evaluation

## 2014-04-21 NOTE — Progress Notes (Signed)
   04/21/14 1430  OBSTRUCTIVE SLEEP APNEA  Have you ever been diagnosed with sleep apnea through a sleep study? No  Do you snore loudly (loud enough to be heard through closed doors)?  0  Do you often feel tired, fatigued, or sleepy during the daytime? 1  Has anyone observed you stop breathing during your sleep? 0  Do you have, or are you being treated for high blood pressure? 1  BMI more than 35 kg/m2? 1  Age over 67 years old? 1  Neck circumference greater than 40 cm/16 inches? 1  Gender: 0  Obstructive Sleep Apnea Score 5

## 2014-04-21 NOTE — Progress Notes (Signed)
Quick Note:  These results are acceptable for scheduled surgery.  Amro Winebarger M. Zillah Alexie, MD, FACS [facility] Office: [phone]   ______ 

## 2014-04-22 NOTE — Progress Notes (Signed)
Quick Note:  EKG is acceptable for scheduled surgery.  Elysabeth Aust M. Jayleana Colberg, MD, FACS [facility] Office: [phone]   ______ 

## 2014-04-22 NOTE — Progress Notes (Signed)
Final EKG done 04/21/14 in [facility].

## 2014-04-22 NOTE — Progress Notes (Signed)
Quick Note:  Pre-operative chest x-ray is acceptable for scheduled surgery.  Amed Datta M. Tian Davison, MD, FACS [facility] Office: [phone]   ______ 

## 2014-04-23 ENCOUNTER — Ambulatory Visit (HOSPITAL_COMMUNITY): Payer: 59 | Admitting: Anesthesiology

## 2014-04-23 ENCOUNTER — Encounter (HOSPITAL_COMMUNITY): Payer: Self-pay | Admitting: Surgery

## 2014-04-23 ENCOUNTER — Observation Stay (HOSPITAL_COMMUNITY)
Admission: RE | Admit: 2014-04-23 | Discharge: 2014-04-24 | Disposition: A | Discharge status: Home / Self Care | Payer: 59 | Source: Ambulatory Visit | Attending: Surgery | Admitting: Surgery

## 2014-04-23 ENCOUNTER — Encounter (HOSPITAL_COMMUNITY)
Admission: RE | Disposition: A | Discharge status: Home / Self Care | Payer: Self-pay | Source: Ambulatory Visit | Attending: Surgery

## 2014-04-23 DIAGNOSIS — Z8541 Personal history of malignant neoplasm of cervix uteri: Secondary | ICD-10-CM | POA: Diagnosis not present

## 2014-04-23 DIAGNOSIS — E041 Nontoxic single thyroid nodule: Principal | ICD-10-CM | POA: Insufficient documentation

## 2014-04-23 DIAGNOSIS — Z9013 Acquired absence of bilateral breasts and nipples: Secondary | ICD-10-CM | POA: Insufficient documentation

## 2014-04-23 DIAGNOSIS — I10 Essential (primary) hypertension: Secondary | ICD-10-CM | POA: Insufficient documentation

## 2014-04-23 DIAGNOSIS — Z79899 Other long term (current) drug therapy: Secondary | ICD-10-CM | POA: Insufficient documentation

## 2014-04-23 DIAGNOSIS — Z853 Personal history of malignant neoplasm of breast: Secondary | ICD-10-CM | POA: Insufficient documentation

## 2014-04-23 DIAGNOSIS — Z7982 Long term (current) use of aspirin: Secondary | ICD-10-CM | POA: Diagnosis not present

## 2014-04-23 DIAGNOSIS — D440 Neoplasm of uncertain behavior of thyroid gland: Secondary | ICD-10-CM

## 2014-04-23 HISTORY — PX: THYROIDECTOMY: SHX17

## 2014-04-23 SURGERY — THYROIDECTOMY
Anesthesia: General | Site: Neck

## 2014-04-23 MED ORDER — FENTANYL CITRATE 0.05 MG/ML IJ SOLN
25.0000 ug | INTRAMUSCULAR | Status: DC | PRN
  Administered 2014-04-23: 25 ug via INTRAVENOUS
  Administered 2014-04-23: 50 ug via INTRAVENOUS
  Administered 2014-04-23: 25 ug via INTRAVENOUS

## 2014-04-23 MED ORDER — DEXAMETHASONE SODIUM PHOSPHATE 10 MG/ML IJ SOLN
INTRAMUSCULAR | Status: DC | PRN
  Administered 2014-04-23: 10 mg via INTRAVENOUS

## 2014-04-23 MED ORDER — FENTANYL CITRATE 0.05 MG/ML IJ SOLN
INTRAMUSCULAR | Status: AC
  Filled 2014-04-23: qty 2

## 2014-04-23 MED ORDER — MIDAZOLAM HCL 5 MG/5ML IJ SOLN
INTRAMUSCULAR | Status: DC | PRN
  Administered 2014-04-23: 2 mg via INTRAVENOUS

## 2014-04-23 MED ORDER — FENTANYL CITRATE 0.05 MG/ML IJ SOLN
INTRAMUSCULAR | Status: DC | PRN
  Administered 2014-04-23: 50 ug via INTRAVENOUS
  Administered 2014-04-23: 100 ug via INTRAVENOUS
  Administered 2014-04-23: 50 ug via INTRAVENOUS

## 2014-04-23 MED ORDER — FUROSEMIDE 20 MG PO TABS
20.0000 mg | ORAL_TABLET | Freq: Every evening | ORAL | Status: DC
  Administered 2014-04-23: 20 mg via ORAL
  Filled 2014-04-23 (×2): qty 1

## 2014-04-23 MED ORDER — LEVOTHYROXINE SODIUM 100 MCG PO TABS
100.0000 ug | ORAL_TABLET | Freq: Every day | ORAL | Status: DC
  Administered 2014-04-24: 100 ug via ORAL
  Filled 2014-04-23 (×2): qty 1

## 2014-04-23 MED ORDER — LEVOTHYROXINE SODIUM 100 MCG PO TABS
100.0000 ug | ORAL_TABLET | Freq: Every day | ORAL | Status: DC
  Filled 2014-04-23: qty 1

## 2014-04-23 MED ORDER — ROCURONIUM BROMIDE 100 MG/10ML IV SOLN
INTRAVENOUS | Status: DC | PRN
  Administered 2014-04-23 (×3): 10 mg via INTRAVENOUS
  Administered 2014-04-23: 20 mg via INTRAVENOUS

## 2014-04-23 MED ORDER — LIDOCAINE HCL (CARDIAC) 20 MG/ML IV SOLN
INTRAVENOUS | Status: AC
  Filled 2014-04-23: qty 5

## 2014-04-23 MED ORDER — GLYCOPYRROLATE 0.2 MG/ML IJ SOLN
INTRAMUSCULAR | Status: DC | PRN
  Administered 2014-04-23: 0.6 mg via INTRAVENOUS

## 2014-04-23 MED ORDER — ONDANSETRON HCL 4 MG PO TABS: 4.0000 mg | ORAL_TABLET | Freq: Four times a day (QID) | ORAL | Status: DC | PRN

## 2014-04-23 MED ORDER — SUCCINYLCHOLINE CHLORIDE 20 MG/ML IJ SOLN
INTRAMUSCULAR | Status: DC | PRN
  Administered 2014-04-23: 60 mg via INTRAVENOUS

## 2014-04-23 MED ORDER — ACETAMINOPHEN 325 MG PO TABS
650.0000 mg | ORAL_TABLET | ORAL | Status: DC | PRN
  Administered 2014-04-23 – 2014-04-24 (×2): 650 mg via ORAL
  Filled 2014-04-23 (×2): qty 2

## 2014-04-23 MED ORDER — ROCURONIUM BROMIDE 100 MG/10ML IV SOLN
INTRAVENOUS | Status: AC
  Filled 2014-04-23: qty 1

## 2014-04-23 MED ORDER — 0.9 % SODIUM CHLORIDE (POUR BTL) OPTIME
TOPICAL | Status: DC | PRN
  Administered 2014-04-23: 1000 mL

## 2014-04-23 MED ORDER — ONDANSETRON HCL 4 MG/2ML IJ SOLN: 4.0000 mg | Freq: Four times a day (QID) | INTRAMUSCULAR | Status: DC | PRN

## 2014-04-23 MED ORDER — ALPRAZOLAM 0.25 MG PO TABS: 0.2500 mg | ORAL_TABLET | Freq: Four times a day (QID) | ORAL | Status: DC | PRN

## 2014-04-23 MED ORDER — MEPERIDINE HCL 50 MG/ML IJ SOLN: 6.2500 mg | INTRAMUSCULAR | Status: DC | PRN

## 2014-04-23 MED ORDER — GLYCOPYRROLATE 0.2 MG/ML IJ SOLN
INTRAMUSCULAR | Status: AC
  Filled 2014-04-23: qty 3

## 2014-04-23 MED ORDER — PROPOFOL 10 MG/ML IV BOLUS
INTRAVENOUS | Status: AC
  Filled 2014-04-23: qty 20

## 2014-04-23 MED ORDER — CEFAZOLIN SODIUM-DEXTROSE 2-3 GM-% IV SOLR
INTRAVENOUS | Status: AC
  Filled 2014-04-23: qty 50

## 2014-04-23 MED ORDER — DEXAMETHASONE SODIUM PHOSPHATE 10 MG/ML IJ SOLN
INTRAMUSCULAR | Status: AC
  Filled 2014-04-23: qty 1

## 2014-04-23 MED ORDER — LIDOCAINE HCL (CARDIAC) 20 MG/ML IV SOLN
INTRAVENOUS | Status: DC | PRN
  Administered 2014-04-23: 100 mg via INTRAVENOUS

## 2014-04-23 MED ORDER — KCL IN DEXTROSE-NACL 30-5-0.45 MEQ/L-%-% IV SOLN
INTRAVENOUS | Status: DC
  Administered 2014-04-23: 18:00:00 via INTRAVENOUS
  Filled 2014-04-23 (×2): qty 1000

## 2014-04-23 MED ORDER — LACTATED RINGERS IV SOLN: INTRAVENOUS | Status: DC

## 2014-04-23 MED ORDER — NEOSTIGMINE METHYLSULFATE 10 MG/10ML IV SOLN
INTRAVENOUS | Status: DC | PRN
  Administered 2014-04-23: 5 mg via INTRAVENOUS

## 2014-04-23 MED ORDER — CEFAZOLIN SODIUM-DEXTROSE 2-3 GM-% IV SOLR
2.0000 g | INTRAVENOUS | Status: AC
  Administered 2014-04-23: 2 g via INTRAVENOUS

## 2014-04-23 MED ORDER — MIDAZOLAM HCL 2 MG/2ML IJ SOLN
INTRAMUSCULAR | Status: AC
  Filled 2014-04-23: qty 2

## 2014-04-23 MED ORDER — CALCIUM CARBONATE 1250 (500 CA) MG PO TABS
2.0000 | ORAL_TABLET | Freq: Three times a day (TID) | ORAL | Status: DC
  Administered 2014-04-23 – 2014-04-24 (×2): 1000 mg via ORAL
  Filled 2014-04-23 (×5): qty 2

## 2014-04-23 MED ORDER — LACTATED RINGERS IV SOLN
INTRAVENOUS | Status: DC
  Administered 2014-04-23: 13:00:00 via INTRAVENOUS
  Administered 2014-04-23: 1000 mL via INTRAVENOUS

## 2014-04-23 MED ORDER — PHENYLEPHRINE HCL 10 MG/ML IJ SOLN
INTRAMUSCULAR | Status: DC | PRN
  Administered 2014-04-23: 80 ug via INTRAVENOUS

## 2014-04-23 MED ORDER — HYDROCODONE-ACETAMINOPHEN 5-325 MG PO TABS
1.0000 | ORAL_TABLET | ORAL | Status: DC | PRN
  Administered 2014-04-23: 1 via ORAL
  Filled 2014-04-23: qty 1

## 2014-04-23 MED ORDER — NEOSTIGMINE METHYLSULFATE 10 MG/10ML IV SOLN
INTRAVENOUS | Status: AC
  Filled 2014-04-23: qty 1

## 2014-04-23 MED ORDER — HYDROMORPHONE HCL 1 MG/ML IJ SOLN
0.2500 mg | INTRAMUSCULAR | Status: DC | PRN
  Administered 2014-04-23 (×2): 0.5 mg via INTRAVENOUS

## 2014-04-23 MED ORDER — ONDANSETRON HCL 4 MG/2ML IJ SOLN
INTRAMUSCULAR | Status: DC | PRN
  Administered 2014-04-23: 4 mg via INTRAVENOUS

## 2014-04-23 MED ORDER — HYDROMORPHONE HCL 1 MG/ML IJ SOLN: 1.0000 mg | INTRAMUSCULAR | Status: DC | PRN

## 2014-04-23 MED ORDER — ONDANSETRON HCL 4 MG/2ML IJ SOLN
INTRAMUSCULAR | Status: AC
  Filled 2014-04-23: qty 2

## 2014-04-23 MED ORDER — HYDROMORPHONE HCL 1 MG/ML IJ SOLN
INTRAMUSCULAR | Status: AC
  Filled 2014-04-23: qty 1

## 2014-04-23 MED ORDER — FENTANYL CITRATE 0.05 MG/ML IJ SOLN
INTRAMUSCULAR | Status: AC
  Filled 2014-04-23: qty 5

## 2014-04-23 MED ORDER — PROMETHAZINE HCL 25 MG/ML IJ SOLN: 6.2500 mg | INTRAMUSCULAR | Status: DC | PRN

## 2014-04-23 MED ORDER — PROPOFOL 10 MG/ML IV BOLUS
INTRAVENOUS | Status: DC | PRN
  Administered 2014-04-23: 200 mg via INTRAVENOUS

## 2014-04-23 MED ORDER — ZOLPIDEM TARTRATE 5 MG PO TABS: 5.0000 mg | ORAL_TABLET | Freq: Every evening | ORAL | Status: DC | PRN

## 2014-04-23 SURGICAL SUPPLY — 39 items
APL SKNCLS STERI-STRIP NONHPOA (GAUZE/BANDAGES/DRESSINGS) ×1
ATTRACTOMAT 16X20 MAGNETIC DRP (DRAPES) ×3 IMPLANT
BENZOIN TINCTURE PRP APPL 2/3 (GAUZE/BANDAGES/DRESSINGS) ×3 IMPLANT
BLADE HEX COATED 2.75 (ELECTRODE) ×3 IMPLANT
BLADE SURG 15 STRL LF DISP TIS (BLADE) ×1 IMPLANT
BLADE SURG 15 STRL SS (BLADE) ×3
CANISTER SUCT 3000ML (MISCELLANEOUS) ×3 IMPLANT
CHLORAPREP W/TINT 26ML (MISCELLANEOUS) ×3 IMPLANT
CLIP TI MEDIUM 6 (CLIP) ×12 IMPLANT
CLIP TI WIDE RED SMALL 6 (CLIP) ×12 IMPLANT
CLOSURE WOUND 1/2 X4 (GAUZE/BANDAGES/DRESSINGS) ×1
DISSECTOR ROUND CHERRY 3/8 STR (MISCELLANEOUS) IMPLANT
DRAPE PED LAPAROTOMY (DRAPES) ×3 IMPLANT
DRESSING SURGICEL FIBRLLR 1X2 (HEMOSTASIS) ×1 IMPLANT
DRSG SURGICEL FIBRILLAR 1X2 (HEMOSTASIS) ×3
ELECT REM PT RETURN 9FT ADLT (ELECTROSURGICAL) ×3
ELECTRODE REM PT RTRN 9FT ADLT (ELECTROSURGICAL) ×1 IMPLANT
GAUZE SPONGE 4X4 12PLY STRL (GAUZE/BANDAGES/DRESSINGS) ×2 IMPLANT
GAUZE SPONGE 4X4 16PLY XRAY LF (GAUZE/BANDAGES/DRESSINGS) ×3 IMPLANT
GLOVE SURG ORTHO 8.0 STRL STRW (GLOVE) ×3 IMPLANT
GOWN STRL REUS W/TWL XL LVL3 (GOWN DISPOSABLE) ×6 IMPLANT
KIT BASIN OR (CUSTOM PROCEDURE TRAY) ×3 IMPLANT
NS IRRIG 1000ML POUR BTL (IV SOLUTION) ×3 IMPLANT
PACK BASIC VI WITH GOWN DISP (CUSTOM PROCEDURE TRAY) ×3 IMPLANT
PENCIL BUTTON HOLSTER BLD 10FT (ELECTRODE) ×3 IMPLANT
SHEARS HARMONIC 9CM CVD (BLADE) ×3 IMPLANT
STAPLER VISISTAT 35W (STAPLE) IMPLANT
STRIP CLOSURE SKIN 1/2X4 (GAUZE/BANDAGES/DRESSINGS) ×2 IMPLANT
SUT MNCRL AB 4-0 PS2 18 (SUTURE) ×3 IMPLANT
SUT SILK 2 0 (SUTURE)
SUT SILK 2-0 18XBRD TIE 12 (SUTURE) IMPLANT
SUT SILK 3 0 (SUTURE)
SUT SILK 3-0 18XBRD TIE 12 (SUTURE) IMPLANT
SUT VIC AB 3-0 SH 18 (SUTURE) ×6 IMPLANT
SYR BULB IRRIGATION 50ML (SYRINGE) ×3 IMPLANT
TAPE CLOTH SURG 4X10 WHT LF (GAUZE/BANDAGES/DRESSINGS) ×2 IMPLANT
TOWEL OR 17X26 10 PK STRL BLUE (TOWEL DISPOSABLE) ×3 IMPLANT
TOWEL OR NON WOVEN STRL DISP B (DISPOSABLE) ×3 IMPLANT
YANKAUER SUCT BULB TIP 10FT TU (MISCELLANEOUS) ×3 IMPLANT

## 2014-04-23 NOTE — H&P (Signed)
General Surgery [facility]  Ivan Anchors [place] Patient #: [id number] DOB: [birth year] Married / Language: Undefined / Race: Undefined Female  History of Present Illness The patient is a 68 year old female who presents with a thyroid nodule. Patient is referred by Dr. Delrae Rend for evaluation of bilateral thyroid nodules with cytologic atypia. Patient was initially diagnosed with bilateral thyroid nodules and 2008 at the time of the diagnosis of her breast cancer. She underwent bilateral mastectomies. She was followed with thyroid ultrasound by her endocrinologist, Dr. Margaretmary Dys. She underwent fine needle aspiration biopsy which was reportedly benign. Patient is now followed by Dr. Delrae Rend. Recent thyroid ultrasound demonstrates enlargement of bilateral thyroid nodules. Calcifications are present. Ultrasound-guided fine needle aspiration biopsy was performed. There was evidence of cytologic atypia with nuclear changes and hypercellularity in the right thyroid nodule which now measures 6.1 cm in size. Left thyroid nodule measures 4.4 cm and had benign cytopathology. Patient denies any compressive symptoms. She denies tremors or palpitations. She has had no prior head or neck surgery. She is currently taking Synthroid 100 mcg daily and has been on this dosage for several years. There is a family history of partial thyroidectomy in the patient's father. The patient's sister also has some type of thyroid disorder. There is no family history of other endocrine neoplasm.   Other Problems Breast Cancer Cervical Cancer High blood pressure Thyroid Disease  Past Surgical History Breast Biopsy Bilateral. Cataract Surgery Bilateral. Foot Surgery Right. Knee Surgery Right. Mastectomy Bilateral. Sentinel Lymph Node Biopsy  Diagnostic Studies History Colonoscopy >10 years ago Pap Smear 1-5 years ago  Allergies No Known Drug Allergies11/06/2013  Medication  History  Furosemide (Oral) Specific dose unknown - Active. Synthroid (Oral) Specific dose unknown - Active. Aspirin EC Low Strength (Oral) Specific dose unknown - Active. Vitamin D (Cholecalciferol) (1000UNIT Tablet, 2 Oral daily) Active. Vitamin C (Oral) Specific dose unknown - Active. Vitamin B Complex (Oral) Specific dose unknown - Active.  Social History  Alcohol use Moderate alcohol use. Caffeine use Coffee. No drug use Tobacco use Former smoker.  Family History Arthritis Mother. Breast Cancer Mother. Heart Disease Sister. Heart disease in female family member before age 57 Hypertension Mother. Ovarian Cancer Daughter. Prostate Cancer Brother, Father. Thyroid problems Father.  Pregnancy / Birth History Age at menarche 79 years. Age of menopause <46 Gravida 2 Maternal age 37-30 Para 2  Review of Systems  General Not Present- Appetite Loss, Chills, Fatigue, Fever, Night Sweats, Weight Gain and Weight Loss. Skin Not Present- Change in Wart/Mole, Dryness, Hives, Jaundice, New Lesions, Non-Healing Wounds, Rash and Ulcer. HEENT Present- Wears glasses/contact lenses. Not Present- Earache, Hearing Loss, Hoarseness, Nose Bleed, Oral Ulcers, Ringing in the Ears, Seasonal Allergies, Sinus Pain, Sore Throat, Visual Disturbances and Yellow Eyes. Respiratory Not Present- Bloody sputum, Chronic Cough, Difficulty Breathing, Snoring and Wheezing. Breast Not Present- Breast Mass, Breast Pain, Nipple Discharge and Skin Changes. Cardiovascular Present- Swelling of Extremities. Not Present- Chest Pain, Difficulty Breathing Lying Down, Leg Cramps, Palpitations, Rapid Heart Rate and Shortness of Breath. Gastrointestinal Not Present- Abdominal Pain, Bloating, Bloody Stool, Change in Bowel Habits, Chronic diarrhea, Constipation, Difficulty Swallowing, Excessive gas, Gets full quickly at meals, Hemorrhoids, Indigestion, Nausea, Rectal Pain and Vomiting. Female Genitourinary  Not Present- Frequency, Nocturia, Painful Urination, Pelvic Pain and Urgency. Musculoskeletal Present- Muscle Weakness and Swelling of Extremities. Not Present- Back Pain, Joint Pain, Joint Stiffness and Muscle Pain. Neurological Present- Trouble walking. Not Present- Decreased Memory, Fainting, Headaches, Numbness,  Seizures, Tingling, Tremor and Weakness. Psychiatric Not Present- Anxiety, Bipolar, Change in Sleep Pattern, Depression, Fearful and Frequent crying. Endocrine Not Present- Cold Intolerance, Excessive Hunger, Hair Changes, Heat Intolerance, Hot flashes and New Diabetes. Hematology Present- Gland problems. Not Present- Easy Bruising, Excessive bleeding, HIV and Persistent Infections.   Vitals 03/09/2014 10:08 AM Weight: 250 lb Height: 64in Body Surface Area: 2.26 m Body Mass Index: 42.91 kg/m Temp.: 98.53F(Oral)  Pulse: 110 (Regular)  Resp.: 20 (Unlabored)  BP: 164/96 (Sitting, Left Arm, Standard)    Physical Exam  The physical exam findings are as follows: Note:General - appears comfortable, no distress; not diaphorectic  HEENT - normocephalic; sclerae clear, gaze conjugate; mucous membranes moist, dentition good; voice normal  Neck - symmetric on extension; no palpable anterior or posterior cervical adenopathy; Palpation shows a dominant mass occupying most of the right thyroid lobe and extending into the isthmus anteriorly. There is slight deviation of the trachea to the left. The mass is smooth and nontender. Likewise there is a dominant mass measuring approximately 4 cm and the left thyroid lobe which is mobile with swallowing and nontender.  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 We reviewed her ultrasound and her biopsy reports. I provided her with written literature to review at home.  Patient has bilateral thyroid nodules which are enlarging. The dominant nodule in the right lobe has significant cytologic atypia on fine needle aspiration biopsy. This represents a thyroid neoplasm of uncertain behavior. I suspect her risk of malignancy is approximately 25%. I have recommended total thyroidectomy for management. We discussed the risk and benefits of the procedure. We discussed the possibility of recurrent laryngeal nerve injury and injury to parathyroid glands. We discussed the hospital stay to be anticipated. We discussed the need for lifelong thyroid hormone replacement. In the event of malignancy, she would likely require radioactive iodine treatment and this will be coordinated by her endocrinologist.  Patient would like to contact her physician at [facility] regarding perioperative management of her multiple sclerosis. Patient would like to proceed with scheduling surgery sometime before the end of the year.  The risks and benefits of the procedure have been discussed at length with the patient. The patient understands the proposed procedure, potential alternative treatments, and the course of recovery to be expected. All of the patient's questions have been answered at this time. The patient wishes to proceed with surgery.   Earnstine Regal, MD, [facility] Office: [phone]

## 2014-04-23 NOTE — Transfer of Care (Signed)
Immediate Anesthesia Transfer of Care Note  Patient: Colleen [name]  Procedure(s) Performed: Procedure(s): TOTAL THYROIDECTOMY (N/A)  Patient Location: PACU  Anesthesia Type:General  Level of Consciousness: sedated  Airway & Oxygen Therapy: Patient Spontanous Breathing and Patient connected to face mask oxygen  Post-op Assessment: Report given to PACU RN and Post -op Vital signs reviewed and stable  Post vital signs: Reviewed and stable  Complications: No apparent anesthesia complications

## 2014-04-23 NOTE — Brief Op Note (Signed)
04/23/2014  1:14 PM  PATIENT:  ANN-MARIE KLUGE  68 y.o. female  PRE-OPERATIVE DIAGNOSIS:  thyroid neoplasm of uncertain behavior  POST-OPERATIVE DIAGNOSIS:  thyroid neoplasm of uncertain behavior  PROCEDURE:  Procedure(s): TOTAL THYROIDECTOMY (N/A)  SURGEON:  Surgeon(s) and Role:    * Armandina Gemma, MD - Primary    * Leighton Ruff, MD  ANESTHESIA:   general  EBL:  Total I/O In: 1000 [I.V.:1000] Out: -   BLOOD ADMINISTERED:none  DRAINS: none   LOCAL MEDICATIONS USED:  NONE  SPECIMEN:  Excision  DISPOSITION OF SPECIMEN:  PATHOLOGY  COUNTS:  YES  TOURNIQUET:  * No tourniquets in log *  DICTATION: .Other Dictation: Dictation Number [id number]  PLAN OF CARE: Admit for overnight observation  PATIENT DISPOSITION:  PACU - hemodynamically stable.   Delay start of Pharmacological VTE agent (>24hrs) due to surgical blood loss or risk of bleeding: yes  Earnstine Regal, MD, [facility] Office: [phone]

## 2014-04-23 NOTE — Anesthesia Postprocedure Evaluation (Signed)
  Anesthesia Post-op Note  Patient: Colleen [name]  Procedure(s) Performed: Procedure(s) (LRB): TOTAL THYROIDECTOMY (N/A)  Patient Location: PACU  Anesthesia Type: General  Level of Consciousness: awake and alert   Airway and Oxygen Therapy: Patient Spontanous Breathing  Post-op Pain: mild  Post-op Assessment: Post-op Vital signs reviewed, Patient's Cardiovascular Status Stable, Respiratory Function Stable, Patent Airway and No signs of Nausea or vomiting  Last Vitals:  Filed Vitals:   04/23/14 1430  BP: 168/84  Pulse: 82  Temp: 36.3 C  Resp: 14    Post-op Vital Signs: stable   Complications: No apparent anesthesia complications

## 2014-04-24 ENCOUNTER — Encounter (HOSPITAL_COMMUNITY): Payer: Self-pay | Admitting: Surgery

## 2014-04-24 DIAGNOSIS — E041 Nontoxic single thyroid nodule: Secondary | ICD-10-CM | POA: Diagnosis not present

## 2014-04-24 LAB — BASIC METABOLIC PANEL
ANION GAP: 16 — AB (ref 5–15)
BUN: 13 mg/dL (ref 6–23)
CALCIUM: 9.3 mg/dL (ref 8.4–10.5)
CHLORIDE: 101 meq/L (ref 96–112)
CO2: 24 mEq/L (ref 19–32)
Creatinine, Ser: 0.89 mg/dL (ref 0.50–1.10)
GFR calc Af Amer: 76 mL/min — ABNORMAL LOW (ref >90)
GFR calc non Af Amer: 66 mL/min — ABNORMAL LOW (ref >90)
Glucose, Bld: 182 mg/dL — ABNORMAL HIGH (ref 70–99)
Potassium: 4.5 mEq/L (ref 3.7–5.3)
SODIUM: 141 meq/L (ref 137–147)

## 2014-04-24 MED ORDER — CALCIUM-VITAMIN D 250-125 MG-UNIT PO TABS: 2.0000 | ORAL_TABLET | Freq: Two times a day (BID) | ORAL | Status: DC

## 2014-04-24 NOTE — Progress Notes (Signed)
Discharge instructions given to patient and new dressing applied to incision

## 2014-04-24 NOTE — Op Note (Signed)
Colleen [name], Colleen [name]                 ACCOUNT NO.:  [id number]  MEDICAL RECORD NO.:  [id number]  LOCATION:  74                         FACILITY:  [facility]  PHYSICIAN:  Earnstine Regal, MD      DATE OF BIRTH:  1945-09-25  DATE OF PROCEDURE:  04/23/2014                               OPERATIVE REPORT   PREOPERATIVE DIAGNOSIS:  Bilateral thyroid nodules with cytologic atypia.  POSTOPERATIVE DIAGNOSIS:  Bilateral thyroid nodules with cytologic atypia.  PROCEDURE:  Total thyroidectomy.  SURGEON:  Earnstine Regal, MD, FACS  ASSISTANT:  Leighton Ruff, MD  ANESTHESIA:  General.  ESTIMATED BLOOD LOSS:  Minimal.  PREPARATION:  ChloraPrep.  COMPLICATIONS:  None.  INDICATIONS:  The patient is a 68 year old female referred by her endocrinologist, Dr. Francena Hanly, for total thyroidectomy.  The patient has bilateral thyroid nodules.  These had been diagnosed in 2008 at the time of her diagnosis of breast cancer.  The patient was followed with ultrasound.  Fine needle aspiration of the dominant nodule on the right showed cytologic atypia with nuclear changes in hypercellularity.  There were calcifications present.  The patient now comes to Surgery for resection for definitive diagnosis.  BODY OF REPORT:  The patient was brought to OR #1 at the [facility] and placed in supine position on the operating room table.  Following induction of general anesthesia, the patient was positioned and then prepped and draped in the usual aseptic fashion. After ascertaining that an adequate level of anesthesia had been achieved, a Kocher incision was made with a #15 blade.  Dissection was carried through subcutaneous tissues and platysma.  Hemostasis was achieved with the electrocautery.  Skin flaps were elevated cephalad and caudad from the thyroid notch to the sternal notch.  A Mahorner self- retaining retractor was placed for exposure.  Strap muscles were incised in the midline.   Dissection was begun on the left.  Strap muscles were reflected laterally exposing a multinodular left thyroid lobe.  Middle thyroid vein was divided between Ligaclips with the Harmonic scalpel. Gland was gently mobilized with gentle blunt dissection.  Superior pole was dissected out and superior pole vessels divided individually between small and medium Ligaclips with the Harmonic scalpel.  Parathyroid tissue was identified and preserved.  Inferior venous tributaries were divided between Ligaclips.  Gland was rolled anteriorly.  Branches of the inferior thyroid artery were divided between small and medium Ligaclips with the Harmonic scalpel.  Recurrent laryngeal nerve was identified and preserved.  Ligament of Gwenlyn Found was released with the electrocautery and the gland was mobilized onto the anterior trachea. Isthmus was mobilized across the midline.  There was no significant pyramidal lobe present.  Next, turned our attention to the right thyroid lobe.  Again, the lobe was enlarged, more so than the left, and contains multiple nodules. Strap muscles were reflected laterally.  Middle thyroid vein divided between medium Ligaclips with the Harmonic scalpel.  Superior pole vessels were dissected out individually and divided between small and medium Ligaclips with the Harmonic scalpel.  Inferior venous tributaries were dissected out and divided between Ligaclips with the Harmonic scalpel.  Adjacent to the inferior pole of the  right lobe was moderately enlarged lymph node, just over 1 cm in size.  This was gently dissected out and mobilized.  Its pedicle was transected between Ligaclips and it was excised and submitted with the specimen for review.  Right lobe is rotated anteriorly.  Superior pole vessels were divided individually between Ligaclips with the Harmonic scalpel.  Branches of the inferior thyroid artery were then divided between small and medium Ligaclips with the Harmonic scalpel.   The recurrent laryngeal nerve was identified and preserved.  Ligament of Gwenlyn Found was released with electrocautery and the gland was mobilized onto the anterior trachea from which it was completely excised with the Harmonic scalpel.  Suture was used to mark the right superior pole.  The entire thyroid gland was submitted to Pathology for review.  The neck was irrigated with warm saline.  Good hemostasis was achieved throughout the operative field.  Fibrillar was placed throughout the operative field.  Strap muscles were reapproximated in the midline with interrupted 3-0 Vicryl sutures.  Platysma was closed with interrupted 3- 0 Vicryl sutures.  Skin was closed with a running 4-0 Monocryl subcuticular suture.  Wound was washed and dried and benzoin and Steri- Strips were applied.  Sterile dressings were applied.  The patient was awakened from anesthesia and brought to the recovery room.  The patient tolerated the procedure well.   Earnstine Regal, MD, [facility] Office: [phone]   TMG/MEDQ  D:  04/23/2014  T:  04/24/2014  Job:  [id number]

## 2014-04-24 NOTE — Progress Notes (Signed)
Quick Note:  Please contact patient and notify of benign pathology results.  Josep Luviano M. Lindyn Vossler, MD, FACS [facility] Office: [phone]   ______ 

## 2014-04-24 NOTE — Discharge Summary (Signed)
Physician Discharge Summary [facility]  Patient ID: Colleen [name] MRN: [id number] DOB/AGE: 1946-05-03 68 y.o.  Admit date: 04/23/2014 Discharge date: 04/24/2014  Admission Diagnoses:  Bilateral thyroid nodules with atypia  Discharge Diagnoses:  Principal Problem:   Neoplasm of uncertain behavior of thyroid gland   Discharged Condition: good  Hospital Course: Patient was admitted for observation following thyroid surgery.  Post op course was uncomplicated.  Pain was well controlled.  Tolerated diet.  Post op calcium level on morning following surgery was 9.4 mg/dl.  Patient was prepared for discharge home on POD#1.  Consults: None  Treatments: surgery: total thyroidectomy  Discharge Exam: Blood pressure 144/68, pulse 93, temperature 98 F (36.7 C), temperature source Oral, resp. rate 14, height 5\' 4"  (1.626 m), weight 253 lb (114.76 kg), SpO2 94 %. HEENT - clear Neck - wound dry and intact; voice normal; mild STS Chest - clear bilaterally Cor - RRR   Disposition: Home  Discharge Instructions    Apply dressing    Complete by:  As directed   Apply light gauze dressing to wound before discharge home today. Earnstine Regal, MD, [facility] Office: [phone]     Diet - low sodium heart healthy    Complete by:  As directed      Discharge instructions    Complete by:  As directed   THYROID & PARATHYROID SURGERY - POST OP INSTRUCTIONS  Always review your discharge instruction sheet from the facility where your surgery was performed.  A prescription for pain medication may be given to you upon discharge.  Take your pain medication as prescribed.  If narcotic pain medicine is not needed, then you may take acetaminophen (Tylenol) or ibuprofen (Advil) as needed.  Take your usually prescribed medications unless otherwise directed.  If you need a refill on your pain medication, please contact your pharmacy. They will contact our  office to request authorization.  Prescriptions will not be processed after 5 pm or on weekends.  Start with a light diet upon arrival home, such as soup and crackers or toast.  Be sure to drink plenty of fluids daily.  Resume your normal diet the day after surgery.  Most patients will experience some swelling and bruising on the chest and neck area.  Ice packs will help.  Swelling and bruising can take several days to resolve.   It is common to experience some constipation if taking pain medication after surgery.  Increasing fluid intake and taking a stool softener will usually help or prevent this problem.  A mild laxative (Milk of Magnesia or Miralax) should be taken according to package directions if there are no bowel movements after 48 hours.  You may remove your bandages 24-48 hours after surgery, and you may shower at that time.  You have steri-strips (small skin tapes) in place directly over the incision.  These strips should be left on the skin for 7-10 days and then removed.  You may resume regular (light) daily activities beginning the next day-such as daily self-care, walking, climbing stairs-gradually increasing activities as tolerated.  You may have sexual intercourse when it is comfortable.  Refrain from any heavy lifting or straining until approved by your doctor.  You may drive when you no longer are taking prescription pain medication, you can comfortably wear a seatbelt, and you can safely maneuver your car and apply brakes.  You should see your doctor in the office for a follow-up appointment approximately two  to three weeks after your surgery.  Make sure that you call for this appointment within a day or two after you arrive home to insure a convenient appointment time.  WHEN TO CALL YOUR DOCTOR: -- Fever greater than 101.5 -- Inability to urinate -- Nausea and/or vomiting - persistent -- Extreme swelling or bruising -- Continued bleeding from incision -- Increased pain,  redness, or drainage from the incision -- Difficulty swallowing or breathing -- Muscle cramping or spasms -- Numbness or tingling in hands or around lips  The clinic staff is available to answer your questions during regular business hours.  Please don't hesitate to call and ask to speak to one of the nurses if you have concerns.  Earnstine Regal, MD, [facility] Office: [phone]     Increase activity slowly    Complete by:  As directed      Remove dressing in 24 hours    Complete by:  As directed             Medication List    TAKE these medications        ALPRAZolam 0.25 MG tablet  Commonly known as:  XANAX  Can take 1 tablet every 4 hours as needed for anxiety     aspirin 81 MG tablet  Take 81 mg by mouth every morning.     calcium-vitamin D 250-125 MG-UNIT per tablet  Commonly known as:  OSCAL  Take 2 tablets by mouth 2 (two) times daily.     cholecalciferol 1000 UNITS tablet  Commonly known as:  VITAMIN D  Take 2,000 Units by mouth every morning.     cyanocobalamin 1000 MCG tablet  Take 100 mcg by mouth every morning.     furosemide 20 MG tablet  Commonly known as:  LASIX  Take 20 mg by mouth every evening.     levothyroxine 100 MCG tablet  Commonly known as:  SYNTHROID, LEVOTHROID  Take 100 mcg by mouth every morning.     VITAMIN C PO  Take 1 tablet by mouth at bedtime. 250 MG DAILY     zolpidem 5 MG tablet  Commonly known as:  AMBIEN  Take 1 tablet (5 mg total) by mouth at bedtime as needed for sleep.         Earnstine Regal, MD, [facility] Office: [phone]   Signed: Earnstine Regal 04/24/2014, 9:15 AM

## 2014-08-14 ENCOUNTER — Other Ambulatory Visit: Payer: Self-pay | Admitting: Gastroenterology

## 2014-08-14 DIAGNOSIS — K5730 Diverticulosis of large intestine without perforation or abscess without bleeding: Secondary | ICD-10-CM

## 2014-08-14 DIAGNOSIS — Z1211 Encounter for screening for malignant neoplasm of colon: Secondary | ICD-10-CM

## 2014-10-14 ENCOUNTER — Other Ambulatory Visit: Payer: Medicare Other

## 2014-11-02 ENCOUNTER — Other Ambulatory Visit: Payer: Self-pay

## 2015-03-12 IMAGING — US US THYROID BIOPSY
1 series · 13 of 16 positions shown · non-contrast
Comparison: Prior thyroid ultrasound 01/20/2014

CLINICAL DATA: 68-year-old female with bilateral thyroid goiters
which are slowly enlarging.

EXAM:
ULTRASOUND GUIDED NEEDLE ASPIRATE BIOPSY OF THE THYROID GLAND

[Series 1: us thyroid biopsy · 0.11mm/px · 16 acquisitions, 13 frames shown]
[im 1/16]
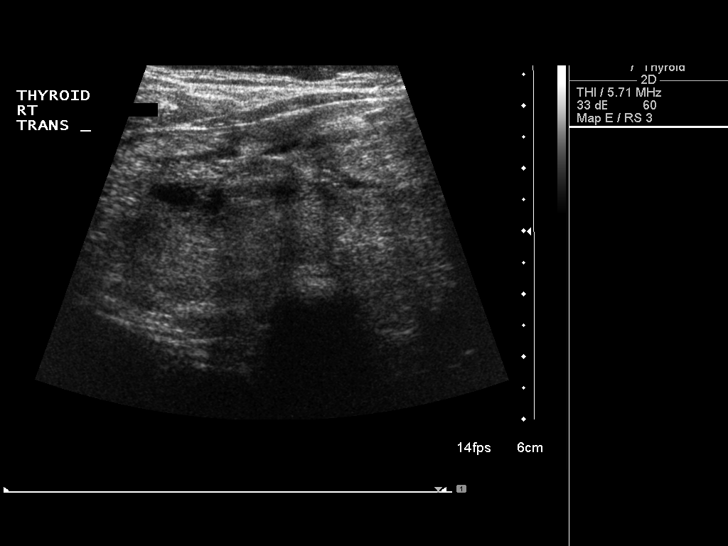
[im 2/16]
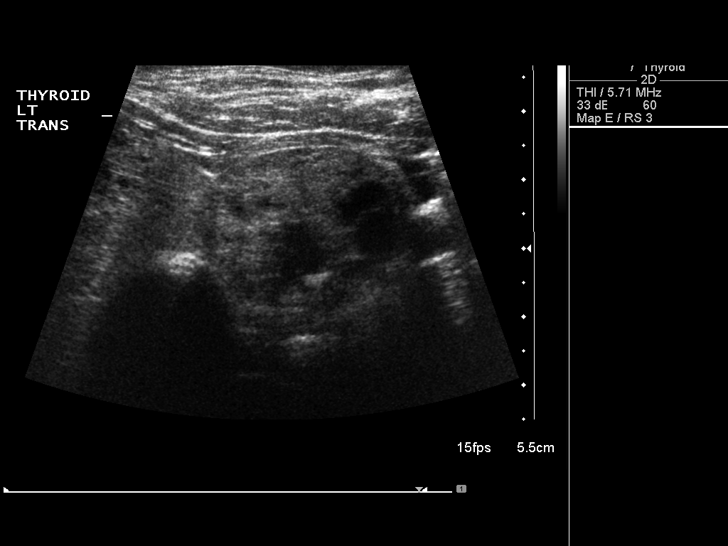
[im 4/16]
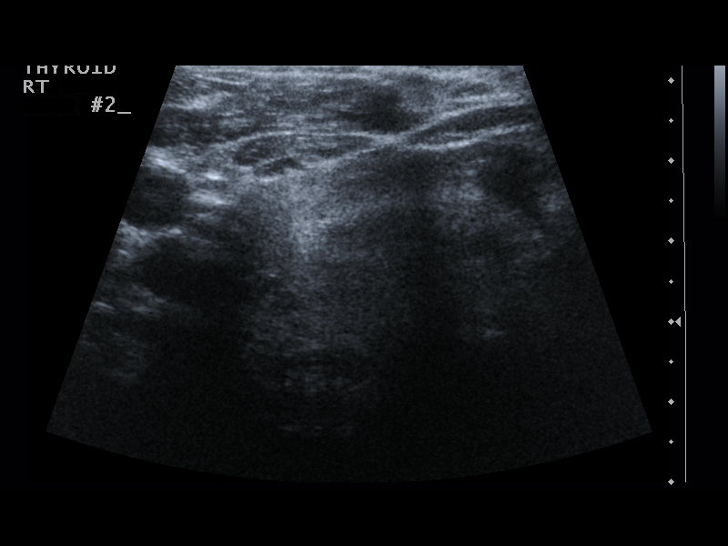
[im 5/16]
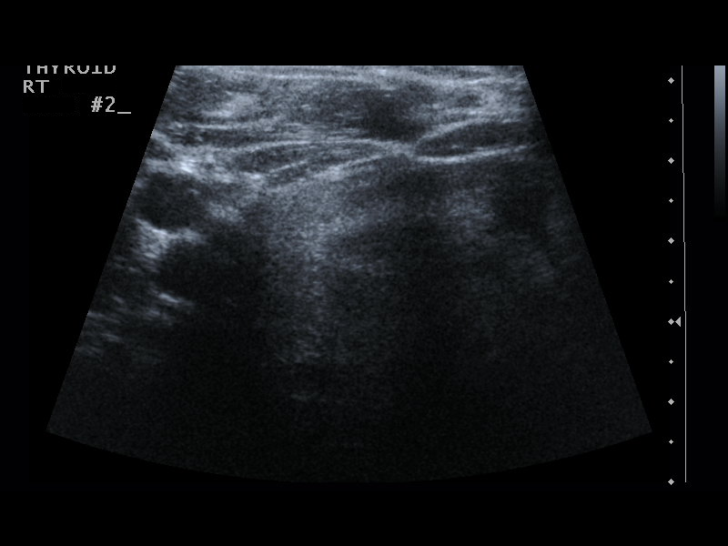
[im 6/16]
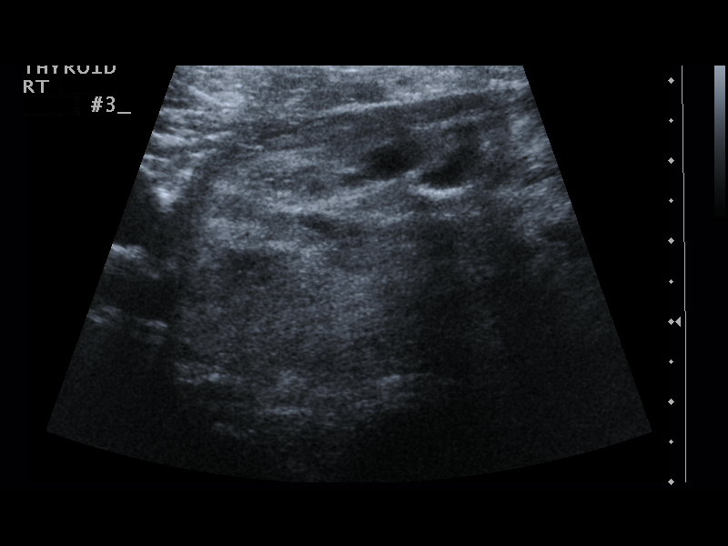
[im 7/16]
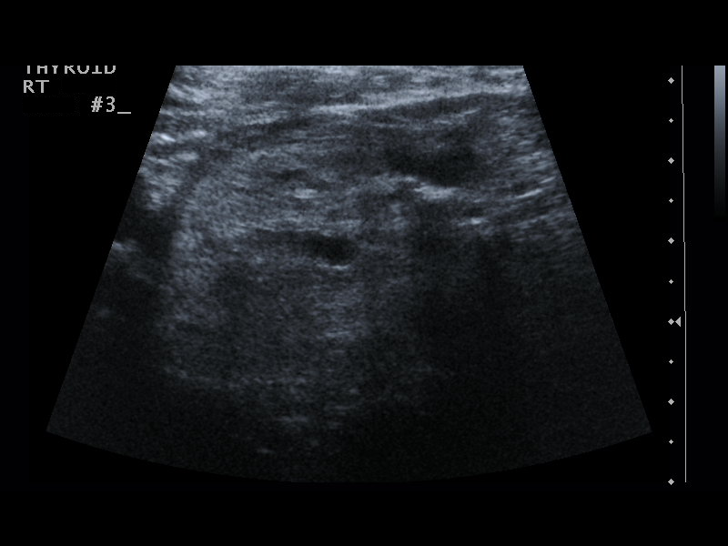
[im 9/16]
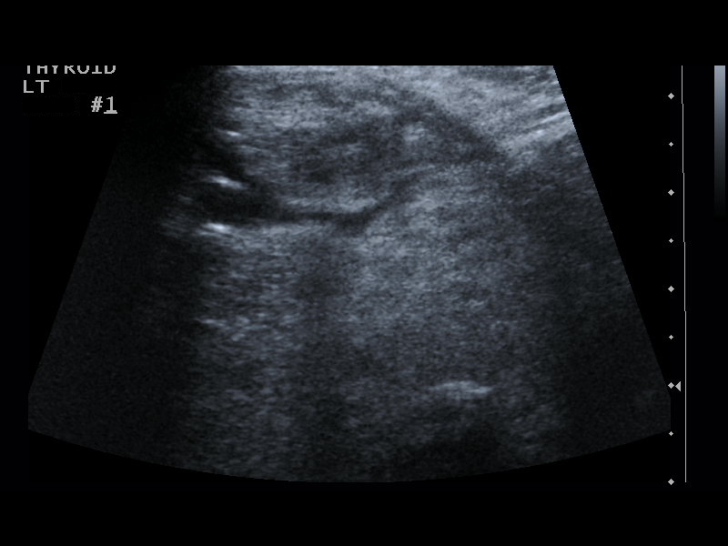
[im 10/16]
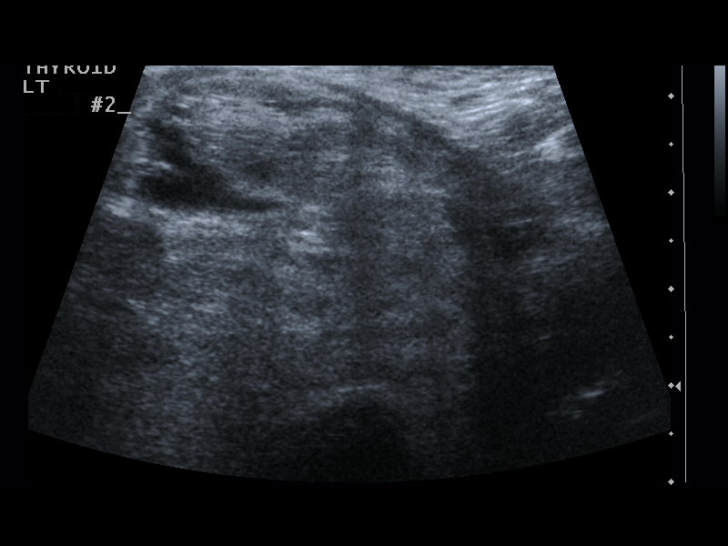
[im 11/16]
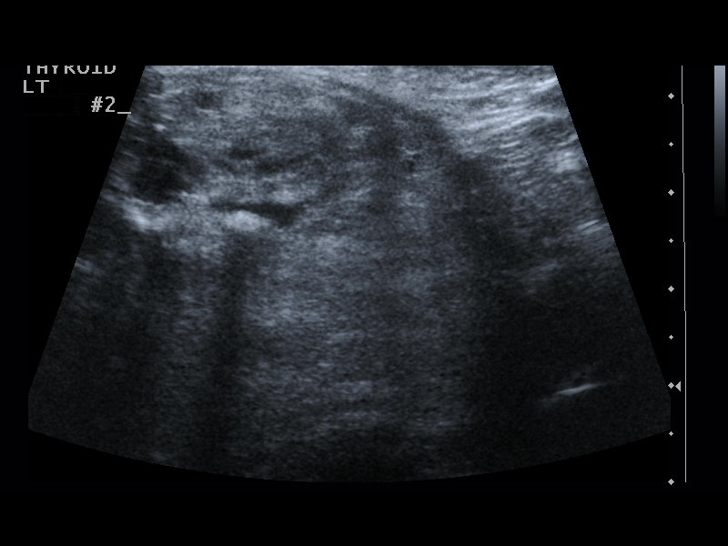
[im 12/16]
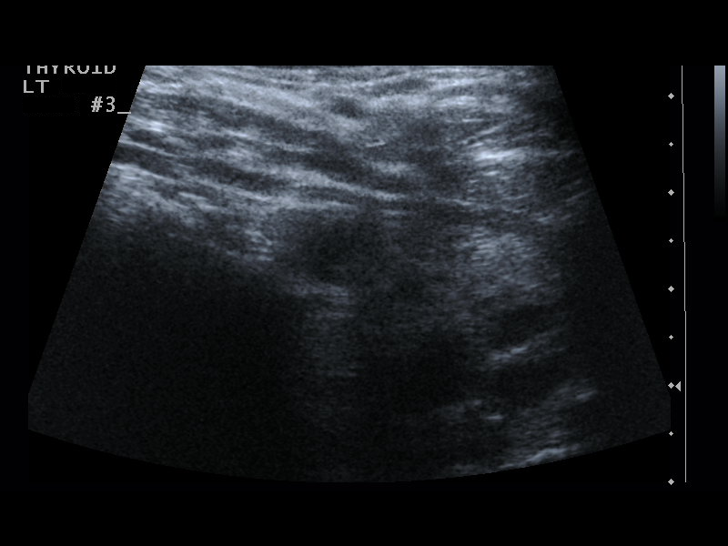
[im 13/16]
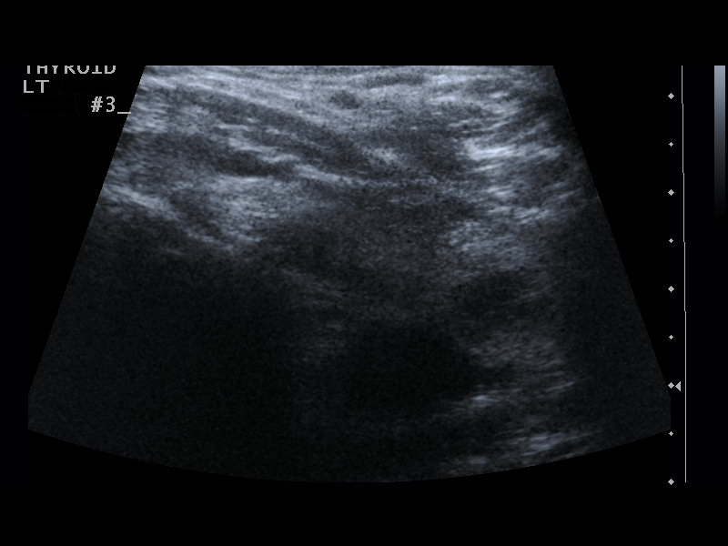
[im 15/16]
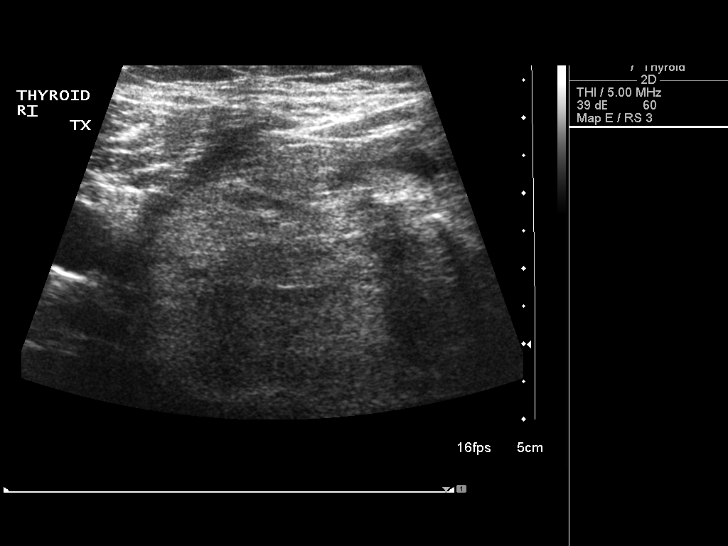
[im 16/16]
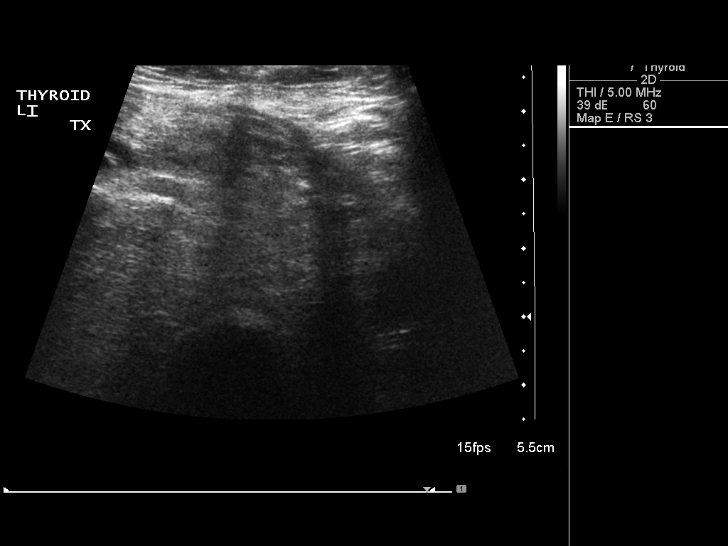

[13 of 16 positions shown; findings below may reference images not displayed]

PROCEDURE:
Thyroid biopsy was thoroughly discussed with the patient and
questions were answered. The benefits, risks, alternatives, and
complications were also discussed. The patient understands and
wishes to proceed with the procedure. Written consent was obtained.

NODULE #2: 6.1 cm heterogeneous predominantly solid nodule in the
right thyroid gland and right aspect of the isthmus.

Ultrasound was performed to localize and mark an adequate site for
the biopsy. The patient was then prepped and draped in a normal
sterile fashion. Local anesthesia was provided with 1% lidocaine.
Using direct ultrasound guidance, 3 passes were made using needles
into the nodule within the right/isthmic lobe of the thyroid.
Ultrasound was used to confirm needle placements on all occasions.
Specimens were sent to Pathology for analysis.

NODULE #2: 4.4 cm partially cystic partially solid nodule in the mid
to inferior aspect of the left gland.

Ultrasound was performed to localize and mark an adequate site for
the biopsy. The patient was then prepped and draped in a normal
sterile fashion. Local anesthesia was provided with 1% lidocaine.
Using direct ultrasound guidance, 3 passes were made using needles
into the nodule within the left lobe of the thyroid. Ultrasound was
used to confirm needle placements on all occasions. Specimens were
sent to Pathology for analysis.

Complications:  None
IMPRESSION: 1. Ultrasound guided needle aspirate biopsy performed of the right
thyroid nodule.
2. Ultrasound guided needle aspirate biopsy performed of the left
thyroid nodule.

## 2015-12-21 LAB — HEMOGLOBIN A1C: Hemoglobin A1C: 5.9

## 2015-12-21 LAB — BASIC METABOLIC PANEL: Glucose: 162 mg/dL

## 2016-01-31 LAB — HEPATIC FUNCTION PANEL
ALT: 18 U/L (ref 7–35)
AST: 21 U/L (ref 13–35)
Bilirubin, Total: 0.4 mg/dL

## 2016-05-03 ENCOUNTER — Ambulatory Visit
Admission: RE | Admit: 2016-05-03 | Discharge: 2016-05-03 | Disposition: A | Discharge status: Home / Self Care | Payer: 59 | Source: Ambulatory Visit | Attending: Geriatric Medicine | Admitting: Geriatric Medicine

## 2016-05-03 ENCOUNTER — Other Ambulatory Visit: Payer: Self-pay | Admitting: Geriatric Medicine

## 2016-05-03 ENCOUNTER — Other Ambulatory Visit (HOSPITAL_COMMUNITY): Payer: Self-pay | Admitting: Geriatric Medicine

## 2016-05-03 ENCOUNTER — Ambulatory Visit (HOSPITAL_COMMUNITY)
Admission: RE | Admit: 2016-05-03 | Discharge: 2016-05-03 | Disposition: A | Discharge status: Home / Self Care | Payer: 59 | Source: Ambulatory Visit | Attending: Geriatric Medicine | Admitting: Geriatric Medicine

## 2016-05-03 DIAGNOSIS — I878 Other specified disorders of veins: Secondary | ICD-10-CM

## 2016-05-03 DIAGNOSIS — I83029 Varicose veins of left lower extremity with ulcer of unspecified site: Secondary | ICD-10-CM

## 2016-05-03 DIAGNOSIS — L97909 Non-pressure chronic ulcer of unspecified part of unspecified lower leg with unspecified severity: Principal | ICD-10-CM

## 2016-05-03 DIAGNOSIS — L97929 Non-pressure chronic ulcer of unspecified part of left lower leg with unspecified severity: Secondary | ICD-10-CM | POA: Insufficient documentation

## 2016-05-03 DIAGNOSIS — I83009 Varicose veins of unspecified lower extremity with ulcer of unspecified site: Secondary | ICD-10-CM

## 2016-05-03 NOTE — Progress Notes (Signed)
VASCULAR LAB PRELIMINARY  PRELIMINARY  PRELIMINARY  PRELIMINARY  VASCULAR LAB PRELIMINARY  ARTERIAL  ABI completed:    RIGHT    LEFT    PRESSURE WAVEFORM  PRESSURE WAVEFORM  BRACHIAL  Triphasic Brachial 160 Triphasic  DP 168 Triphasic DP >255 Triphasic  PT 158 Triphasic PT 203 Triphasic    RIGHT LEFT  ABI 1.05 N/A   Right ABIs indicate normal arterial flow and Doppler waveforms. Left ABI could not be ascertained due to non compressible arteries in the dorsalis pedis. Doppler waveforms however were within normal limits.  Unable to obtain the right brachial pressure due to lymphedema.   Nia Nathaniel, RVS 05/03/2016, 4:51 PM

## 2016-05-05 LAB — CBC AND DIFFERENTIAL
HCT: 41 % (ref 36–46)
Hemoglobin: 13.5 g/dL (ref 12.0–16.0)
Platelets: 303 10*3/uL (ref 150–399)
WBC: 4.7 10^3/mL

## 2016-05-05 LAB — BASIC METABOLIC PANEL
BUN: 16 mg/dL (ref 4–21)
Creatinine: 1 mg/dL (ref 0.5–1.1)
Glucose: 120 mg/dL
Potassium: 4.1 mmol/L (ref 3.4–5.3)
Sodium: 137 mmol/L (ref 137–147)

## 2016-05-09 ENCOUNTER — Encounter: Payer: Self-pay | Admitting: *Deleted

## 2016-05-09 ENCOUNTER — Non-Acute Institutional Stay (SKILLED_NURSING_FACILITY): Payer: 59 | Admitting: Internal Medicine

## 2016-05-09 DIAGNOSIS — I83029 Varicose veins of left lower extremity with ulcer of unspecified site: Secondary | ICD-10-CM

## 2016-05-09 DIAGNOSIS — Z7409 Other reduced mobility: Secondary | ICD-10-CM | POA: Diagnosis not present

## 2016-05-09 DIAGNOSIS — S32591A Other specified fracture of right pubis, initial encounter for closed fracture: Secondary | ICD-10-CM | POA: Diagnosis not present

## 2016-05-09 DIAGNOSIS — G35 Multiple sclerosis: Secondary | ICD-10-CM

## 2016-05-09 DIAGNOSIS — N319 Neuromuscular dysfunction of bladder, unspecified: Secondary | ICD-10-CM

## 2016-05-09 DIAGNOSIS — J209 Acute bronchitis, unspecified: Secondary | ICD-10-CM | POA: Diagnosis not present

## 2016-05-09 DIAGNOSIS — L97929 Non-pressure chronic ulcer of unspecified part of left lower leg with unspecified severity: Secondary | ICD-10-CM

## 2016-05-09 NOTE — Progress Notes (Signed)
Patient ID: Colleen [name], female   DOB: [birth year], 71 y.o.   MRN: [id number]  Provider:  Rexene Edison. Mariea [name], D.O., C.M.D. Location:  [facility] Room Number: [place] of Service:  SNF (31)  PCP: Colleen Argyle, MD Patient Care Team: Colleen Manes, MD as PCP - General (Internal Medicine)  Extended Emergency Contact Information Primary Emergency Contact: Colleen [name] [place] of [place] Phone: [phone] Relation: Daughter  Code Status: DNR Goals of Care: Advanced Directive information Advanced Directives 05/09/2016  Does Patient Have a Medical Advance Directive? Yes  Type of Advance Directive Out of facility DNR (pink MOST or yellow form);Living will;Healthcare Power of Attorney  Does patient want to make changes to medical advance directive? -  Copy of [place] in Chart? Yes  Pre-existing out of facility DNR order (yellow form or pink MOST form) Yellow form placed in chart (order not valid for inpatient use)   Chief Complaint  Patient presents with  . New Admit To SNF    REHAB ADMISSION    HPI: Patient is a 71 y.o. female with gradually progresssive multiple sclerosis (esp over the past 3 years or so), obesity, hypothyroidism seen today for admission to rehab s/p a recent decline in ability to care for herself at home including problems with bronchitis, a fall and a venous ulcer of her LLE.  She arrived here 12/29 after the nurse manager went to evaluate her after [place].  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  She is entirely cognitively intact.    When RN evaluated her at home, she reported chronic left-sided weakness from her MS, left LLE wound, and new inability to bear much weight on her right ankle  since a fall she had where she slid down to the floor.  She had a right hip xray that was negative, but the pelvis portion revealed a possible break in the cortex of the superior ramus laterally.  She is in excruciating pain and unable to move on her own in bed.  She reports being weighed at admission.  Due to the xray findings, the on call APP put her on lovenox therapy just last night.  She is on only tylenol and ibuprofen for pain as of this am.    Right tib/fib xrays 12/27 were negative.  Re: her venous ulcer, she had right ABIs done on 12/27 which revealed normal waveforms on the right, with unobtained brachial pulse due to lymphedema, but let was noncompressible at [facility] but waveforms normal.  She has been getting the wound cleansed with nonsting skin preop, then santyl applied to the wound 1.5x1.5cm and then covered with foam.  On 05/08/16, the wound was noted to have green discharge and some surrounding erythema despite the prophylactic abx with augmentin since 12/26.  She had a wound culture which grew out 3+gram negative rods of 2 types, skin flora, and no wbc.    12/29, Also, she has had a UA c+s that was negative.  Her MMSE score was 30/30.   She's been wheezy and was febrile also on admission and had a negative chest xray.  She was already on the augmentin.  She's been flat in bed since due to her pelvic pain.  She has difficulty with urinary incontinence and does not take any meds due to side effects of dry mouth.  She had been using a scheduled toileting regimen, but also was wearing undergarments in case.    In terms of her MS, when last seen by neuro [facility], she was noted to have gradually progressive disease over the past 3 years or so with increasing functional decline.  She had already failed to respond to solumedrol or ampyra.  She was begun on new infusions in November with ocrelizumab.     Past Medical History:  Diagnosis Date  . 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 Fracture of right lower leg 02/2012   NO SURGERY  . Goiter    On synthroid   . Hypertension   . Multiple sclerosis ([place])    PRIMARY PROGRESSIVE- NEUROLOGIST IS Colleen [name]; -INDEPENDENT LIVING USING POWER CHAIR - DRIVES - WORKS- NOT ABLE TO AMBULATE- CAN PULL UP AND TRANSFER.; WEARS DIAPER - UNABLE TO GET TO BATHROOM.  Marland Kitchen Obesity   . Thyroid nodule    Past Surgical History:  Procedure Laterality Date  . bone spur     removed from rt large toe  . CATARACT EXTRACTION, BILATERAL    . CERVICAL CONIZATION W/BX  1973  . KNEE ARTHROSCOPY W/ MENISCAL REPAIR  2002  . MASTECTOMY, RADICAL  10/08   BILATERAL  ( RIGHT BREAST CANCER )  . THYROIDECTOMY N/A 04/23/2014   Procedure: TOTAL THYROIDECTOMY;  Surgeon: Colleen Gemma, MD;  Location: [facility];  Service: General;  Laterality: N/A;    reports that she has quit smoking. Her smoking use included Cigarettes. She has never used smokeless tobacco. She reports that she drinks about 4.2 oz of alcohol per week . She reports that she does not use drugs. Social History   Social History  . Marital status: Married    Spouse name: N/A  . Number of children: N/A  . Years of education: N/A   Occupational History  . Not on file.   Social History Main Topics  . Smoking status: Former Smoker    Types: Cigarettes  . Smokeless tobacco: Never Used  . Alcohol use 4.2 oz/week    7 Shots of liquor per week     Comment: Colleen [name]  . Drug use: No  . Sexual activity: Not Currently   Other Topics Concern  . Not on file   Social History Narrative  . No narrative on file    Functional Status Survey:    Family History  Problem Relation Age of Onset  . Heart disease Mother   . Cancer Father     prostate cancer  . Heart disease Sister   . Cancer Brother     prostate cancer    Health Maintenance  Topic Date Due  . Hepatitis C Screening  1946/04/09  .  TETANUS/TDAP  01/28/1966  . MAMMOGRAM  01/28/1997  . COLONOSCOPY  01/28/1997  . ZOSTAVAX  01/29/2007  . PNA vac Low Risk Adult (2 of 2 - PCV13) 02/08/2013  . INFLUENZA VACCINE  12/07/2015  . DEXA SCAN  Completed    Allergies  Allergen Reactions  . Losartan Potassium     Decreased GFR, per Dr. Felipa Eth    Allergies as of 05/09/2016      Reactions   Losartan Potassium    Decreased GFR, per Dr. Felipa Eth  Medication List       Accurate as of 05/09/16 11:35 AM. Always use your most recent med list.          acetaminophen 500 MG tablet Commonly known as:  TYLENOL Take 1,500 mg by mouth 2 (two) times daily as needed for moderate pain.   amoxicillin-clavulanate 875-125 MG tablet Commonly known as:  AUGMENTIN take 1 tablet by mouth twice daily for 21 days   aspirin 81 MG tablet Take 81 mg by mouth daily.   calcium-vitamin D 500-200 MG-UNIT tablet Take 2 tablets by mouth daily.   chlorpheniramine 4 MG tablet Commonly known as:  CHLOR-TRIMETON Take 4 mg by mouth every 6 (six) hours as needed (congestion).   collagenase ointment Commonly known as:  SANTYL Apply to venous stasis ulcer on (l)leg everyday and cover with a foam dressing, be sure to apply non-sting skin prep around wound   DELSYM 30 MG/5ML liquid Generic drug:  dextromethorphan Take 10 mLs by mouth 2 (two) times daily as needed for cough.   enoxaparin 40 MG/0.4ML injection Commonly known as:  LOVENOX Inject 40 mg into the skin daily.   furosemide 40 MG tablet Commonly known as:  LASIX Take 40 mg by mouth daily.   ibuprofen 600 MG tablet Commonly known as:  ADVIL,MOTRIN Take 600 mg by mouth every 6 (six) hours as needed.   levothyroxine 175 MCG tablet Commonly known as:  SYNTHROID, LEVOTHROID Take 175 mcg by mouth daily before breakfast.   RA VITAMIN B-12 TR 1000 MCG Tbcr Generic drug:  Cyanocobalamin Take 1 tablet by mouth daily.   Vitamin D3 2000 units capsule Take 2,000 Units by mouth  daily.       Review of Systems  Constitutional: Positive for activity change, appetite change and fatigue. Negative for chills and fever.  HENT: Positive for congestion and postnasal drip. Negative for facial swelling, hearing loss, sinus pain, sore throat and trouble swallowing.   Eyes: Negative for visual disturbance.  Respiratory: Positive for cough and wheezing. Negative for chest tightness and shortness of breath.   Cardiovascular: Negative for chest pain and leg swelling.  Gastrointestinal: Negative for abdominal distention, abdominal pain, constipation, diarrhea, nausea and vomiting.  Genitourinary: Positive for frequency and urgency. Negative for dysuria.       Incontinence due to neurogenic bladder  Musculoskeletal: Positive for gait problem.       Severe right sided groin pain  Skin: Negative for color change.  Neurological: Positive for weakness and numbness. Negative for dizziness, tremors, seizures, syncope, facial asymmetry, speech difficulty, light-headedness and headaches.  Psychiatric/Behavioral: Positive for sleep disturbance. Negative for agitation, confusion and suicidal ideas.       Not sleeping well due to pain    Vitals:   05/09/16 1056  BP: (!) 144/72  Pulse: 72  Resp: 17  Temp: 97.6 F (36.4 C)  TempSrc: Oral  SpO2: 94%  Weight: 270 lb (122.5 kg)  Height: 5\' 4"  (1.626 m)   Body mass index is 46.35 kg/m. Physical Exam  Constitutional: She is oriented to person, place, and time. She appears well-developed and well-nourished. She appears distressed.  Obese female resting in bed, has trapeze overhead and operating her own controls to move bed position  HENT:  Head: Normocephalic and atraumatic.  Right Ear: External ear normal.  Left Ear: External ear normal.  Nasal congestion, stuffiness audible in voice, postnasal drip  Eyes: Conjunctivae and EOM are normal. Pupils are equal, round, and reactive to light.  Neck: Neck  supple. No JVD present.    Cardiovascular: Normal rate, regular rhythm and normal heart sounds.   Pulmonary/Chest: Effort normal and breath sounds normal. No respiratory distress.  Abdominal: Soft. Bowel sounds are normal. She exhibits no distension and no mass. There is no tenderness. There is no rebound and no guarding.  Musculoskeletal:  Unable to elevate left leg; able to move right leg, but causes excruciating pain--yells out; tender in right groin area  Lymphadenopathy:    She has no cervical adenopathy.  Neurological: She is alert and oriented to person, place, and time. No cranial nerve deficit. She exhibits abnormal muscle tone.  Skin: Skin is warm.  LLE shin venous ulcer 1.5cmx1.5cm with pale green drainage, very moist, slight surrounding erythema and some odor  Psychiatric: She has a normal mood and affect. Her behavior is normal. Judgment and thought content normal.    Labs reviewed: Basic Metabolic Panel:  Recent Labs  05/05/16 0400  NA 137  K 4.1  BUN 16  CREATININE 1.0   Liver Function Tests: No results for input(s): AST, ALT, ALKPHOS, BILITOT, PROT, ALBUMIN in the last 8760 hours. No results for input(s): LIPASE, AMYLASE in the last 8760 hours. No results for input(s): AMMONIA in the last 8760 hours. CBC:  Recent Labs  05/05/16 0400  WBC 4.7  HGB 13.5  HCT 41  PLT 303   Cardiac Enzymes: No results for input(s): CKTOTAL, CKMB, CKMBINDEX, TROPONINI in the last 8760 hours. BNP: Invalid input(s): POCBNP No results found for: HGBA1C Lab Results  Component Value Date   TSH 0.467 02/07/2012   No results found for: VITAMINB12 No results found for: FOLATE No results found for: IRON, TIBC, FERRITIN  Imaging and Procedures obtained prior to SNF admission: Dg Tibia/fibula Left  Result Date: 05/03/2016 CLINICAL DATA:  Ulceration lower extremity EXAM: LEFT TIBIA AND FIBULA - 2 VIEW COMPARISON:  None. FINDINGS: Frontal and lateral views were obtained. There is diffuse soft tissue  edema. No fracture or dislocation. No abnormal periosteal reaction. No erosive change or bony destruction. 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 No soft tissue abscess evident by radiography. IMPRESSION: Soft tissue edema. Osteoarthritic change ankle joint. No bony destruction. No fracture or dislocation. Electronically Signed   By: Lowella Grip III M.D.   On: 05/03/2016 15:46    Assessment/Plan 1. Pubic ramus fracture, right, closed, initial encounter ([place]) Right pelvic fracture is quite evident with xray and her degree of pain so more precise imaging does not seem necessary to me D/c tylenol Start percocet 5/325mg  2 tabs q 6 hr scheduled x 7 days for severe pain Then will change order as appropriate Plan to get pt oob via [facility] 05/11/16 due to bronchitis and hopes that she'll be more comfortable--needs PT  2. Venous ulcer of left leg ([facility]) Venous ulcer on left leg grew gram neg rods of multiple varieties and has green drainage so will put on cefdinir 300 q 12 x 10 days for better pseudomonal coverage and change the foam being used to a more absorptive type due to moisture in wound Cont santyl D/c augmentin as not responding to this apparently and bronchitis improving Keep wound care appt 05/16/16  3. Immobility On lovenox until more mobile PT/OT ordered for when she is able to get OOB  4. Acute bronchitis, unspecified organism Improving gradually, will need incentive spirometry when able to stay upright  5. Multiple sclerosis MS:  Was started on new infusions in Nov with ocrelizumab thru neuro at  [facility]--? Frequency--does she need another infusion when over her infections?  6. 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 Carlyle Lipa last  note and notes from [facility] neuro, labs and imaging done here since admission  Labs/tests ordered:  No new labs ordered, consider at next visit depending on progress

## 2016-05-14 DIAGNOSIS — N319 Neuromuscular dysfunction of bladder, unspecified: Secondary | ICD-10-CM | POA: Insufficient documentation

## 2016-05-14 DIAGNOSIS — Z7409 Other reduced mobility: Secondary | ICD-10-CM | POA: Insufficient documentation

## 2016-05-14 DIAGNOSIS — S32591A Other specified fracture of right pubis, initial encounter for closed fracture: Secondary | ICD-10-CM | POA: Insufficient documentation

## 2016-05-14 DIAGNOSIS — I83029 Varicose veins of left lower extremity with ulcer of unspecified site: Secondary | ICD-10-CM | POA: Insufficient documentation

## 2016-05-14 DIAGNOSIS — L97929 Non-pressure chronic ulcer of unspecified part of left lower leg with unspecified severity: Secondary | ICD-10-CM

## 2016-05-16 ENCOUNTER — Encounter (HOSPITAL_BASED_OUTPATIENT_CLINIC_OR_DEPARTMENT_OTHER): Payer: 59 | Attending: Surgery

## 2016-05-16 DIAGNOSIS — G35 Multiple sclerosis: Secondary | ICD-10-CM | POA: Diagnosis not present

## 2016-05-16 DIAGNOSIS — I890 Lymphedema, not elsewhere classified: Secondary | ICD-10-CM | POA: Diagnosis not present

## 2016-05-16 DIAGNOSIS — L97222 Non-pressure chronic ulcer of left calf with fat layer exposed: Secondary | ICD-10-CM | POA: Diagnosis not present

## 2016-05-16 DIAGNOSIS — Z6841 Body Mass Index (BMI) 40.0 and over, adult: Secondary | ICD-10-CM | POA: Insufficient documentation

## 2016-05-16 DIAGNOSIS — Z9013 Acquired absence of bilateral breasts and nipples: Secondary | ICD-10-CM | POA: Diagnosis not present

## 2016-05-16 DIAGNOSIS — Z87891 Personal history of nicotine dependence: Secondary | ICD-10-CM | POA: Insufficient documentation

## 2016-05-16 DIAGNOSIS — E669 Obesity, unspecified: Secondary | ICD-10-CM | POA: Diagnosis not present

## 2016-05-16 DIAGNOSIS — I10 Essential (primary) hypertension: Secondary | ICD-10-CM | POA: Insufficient documentation

## 2016-05-16 DIAGNOSIS — E039 Hypothyroidism, unspecified: Secondary | ICD-10-CM | POA: Insufficient documentation

## 2016-05-16 DIAGNOSIS — Z853 Personal history of malignant neoplasm of breast: Secondary | ICD-10-CM | POA: Diagnosis not present

## 2016-05-23 ENCOUNTER — Encounter: Payer: Self-pay | Admitting: Internal Medicine

## 2016-05-23 ENCOUNTER — Non-Acute Institutional Stay (SKILLED_NURSING_FACILITY): Payer: 59 | Admitting: Internal Medicine

## 2016-05-23 DIAGNOSIS — N319 Neuromuscular dysfunction of bladder, unspecified: Secondary | ICD-10-CM | POA: Diagnosis not present

## 2016-05-23 DIAGNOSIS — L97929 Non-pressure chronic ulcer of unspecified part of left lower leg with unspecified severity: Secondary | ICD-10-CM

## 2016-05-23 DIAGNOSIS — J209 Acute bronchitis, unspecified: Secondary | ICD-10-CM

## 2016-05-23 DIAGNOSIS — Z7409 Other reduced mobility: Secondary | ICD-10-CM

## 2016-05-23 DIAGNOSIS — S32591D Other specified fracture of right pubis, subsequent encounter for fracture with routine healing: Secondary | ICD-10-CM

## 2016-05-23 DIAGNOSIS — I83029 Varicose veins of left lower extremity with ulcer of unspecified site: Secondary | ICD-10-CM | POA: Diagnosis not present

## 2016-05-23 DIAGNOSIS — G35 Multiple sclerosis: Secondary | ICD-10-CM

## 2016-05-23 NOTE — Progress Notes (Signed)
Patient ID: Colleen [name], female   DOB: July 25, 1945, 71 y.o.   MRN: [id number]  Location:  [place] Room Number: [id number] Place of Service:  SNF (31) Provider:  Dawt Reeb L. Mariea Clonts, D.O., C.M.D.  Mathews Argyle, MD  Patient Care Team: Lajean Manes, MD as PCP - General (Internal Medicine)  Extended Emergency Contact Information Primary Emergency Contact: [name] [place] of [place] Phone: [phone] Relation: Daughter  Code Status:  DNR Goals of care: Advanced Directive information Advanced Directives 05/23/2016  Does Patient Have a Medical Advance Directive? Yes  Type of Advance Directive Out of facility DNR (pink MOST or yellow form);Living will;Healthcare Power of Attorney  Does patient want to make changes to medical advance directive? No - Patient declined  Copy of [place] in Chart? Yes  Pre-existing out of facility DNR order (yellow form or pink MOST form) Yellow form placed in chart (order not valid for inpatient use)     Chief Complaint  Patient presents with  . Follow-up    pelvis fracture    HPI:  Pt is a 71 y.o. female seen today for follow up on her pelvic fracture.  Colleen [name] reports improved control of her pain and is sometimes going 24 hrs w/o using her percocet; however, she also is not moving around.  She is still using the hoyer to get up for short periods of time into the chair.  She tells me she is ready and willing to try getting up to sit on the side of the bed and doing some standing and pivoting IF therapy agrees she is safe to try this.  Discussed with therapy that she will need premedication to encourage success with the transfers and positional changes.  She has hopes of returning to home with home caregivers and getting back to work.    Her bronchitis has resolved.    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  Dr. Shelia Media, her neurologist, contacted me and mentioned that she used to take dalsampridine (ampya) and she thought it helped her, but pt claims it did no good.  She is not currently on it.  Dr. Shelia Media also suggested I speak with pt's daughter Colleen [name] who is her 17 and lives in [place] (she is a cardiology PA).  Colleen [name] agreed to this, but said that the nurse, Junie Panning, had already updated her daughter.  She continues to follow with the wound care center for her venous ulcer of her left leg and is still getting santyl applied daily.  She asked about how that would be handled at home (typically pts see the [place] nurse for dressing changes and we try to decrease the frequency for their convenience).  Past Medical History:  Diagnosis Date  . Breast cancer ([place]) 10/08   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  . Cervical atypia   . Fracture of right lower leg 02/2012   NO SURGERY  . Goiter    On synthroid   . History of toxic multinodular goiter   . Hypertension   . Multiple sclerosis ([place])    PRIMARY PROGRESSIVE- NEUROLOGIST IS DR. Hilmar-Irwin; -INDEPENDENT LIVING USING POWER CHAIR - DRIVES - WORKS- NOT ABLE TO AMBULATE- CAN PULL UP AND TRANSFER.; WEARS DIAPER - UNABLE TO GET TO BATHROOM.  Marland Kitchen Obesity   . Renal disease  stage 3  . Thyroid nodule   . Tibia/fibula fracture, right, closed, initial encounter    Past Surgical History:  Procedure Laterality Date  . bone spur     removed from rt large toe  . CATARACT EXTRACTION, BILATERAL    . CERVICAL CONIZATION W/BX  1973  . KNEE ARTHROSCOPY W/ MENISCAL REPAIR  2002  . MASTECTOMY, RADICAL  10/08   BILATERAL  ( RIGHT BREAST CANCER )  . THYROIDECTOMY N/A 04/23/2014   Procedure: TOTAL THYROIDECTOMY;  Surgeon: Armandina Gemma, MD;  Location: [facility];  Service: General;  Laterality: N/A;    Allergies  Allergen Reactions  . Losartan Potassium     Decreased GFR, per Dr. Felipa Eth    Allergies as of  05/23/2016      Reactions   Losartan Potassium    Decreased GFR, per Dr. Felipa Eth      Medication List       Accurate as of 05/23/16 11:37 AM. Always use your most recent med list.          aspirin 81 MG tablet Take 81 mg by mouth daily.   calcium-vitamin D 500-200 MG-UNIT tablet Take 2 tablets by mouth daily.   chlorpheniramine 4 MG tablet Commonly known as:  CHLOR-TRIMETON Take 4 mg by mouth every 6 (six) hours as needed (congestion).   collagenase ointment Commonly known as:  SANTYL Apply to venous stasis ulcer on (l)leg everyday and cover with a foam dressing, be sure to apply non-sting skin prep around wound   DELSYM 30 MG/5ML liquid Generic drug:  dextromethorphan Take 10 mLs by mouth 2 (two) times daily as needed for cough.   enoxaparin 40 MG/0.4ML injection Commonly known as:  LOVENOX Inject 40 mg into the skin daily.   feeding supplement Liqd Take 1 Container by mouth daily.   furosemide 40 MG tablet Commonly known as:  LASIX Take 40 mg by mouth daily.   ibuprofen 600 MG tablet Commonly known as:  ADVIL,MOTRIN Take 600 mg by mouth every 6 (six) hours as needed.   levothyroxine 175 MCG tablet Commonly known as:  SYNTHROID, LEVOTHROID Take 175 mcg by mouth daily before breakfast.   oxyCODONE-acetaminophen 5-325 MG tablet Commonly known as:  PERCOCET/ROXICET Take 1 tablet by mouth every 6 (six) hours as needed for moderate pain. & 2 tablets by mouth every 6 hours as needed for severe pain   polyethylene glycol packet Commonly known as:  MIRALAX / GLYCOLAX Take 17 g by mouth daily.   RA VITAMIN B-12 TR 1000 MCG Tbcr Generic drug:  Cyanocobalamin Take 1 tablet by mouth daily.   Vitamin D3 2000 units capsule Take 2,000 Units by mouth daily.       Review of Systems  Constitutional: Positive for activity change. Negative for appetite change, fatigue and fever.  Eyes: Negative for visual disturbance.  Respiratory: Negative for cough, chest  tightness, shortness of breath and wheezing.   Cardiovascular: Negative for chest pain, palpitations and leg swelling.  Genitourinary: Positive for frequency and pelvic pain. Negative for dysuria.       Incontinence  Musculoskeletal: Positive for gait problem.  Skin:       Left venous ulcer improving, abx completed  Neurological: Positive for weakness.  Psychiatric/Behavioral: Negative for confusion.    Immunization History  Administered Date(s) Administered  . Influenza Split 02/09/2012  . Pneumococcal Polysaccharide-23 02/09/2012   Pertinent  Health Maintenance Due  Topic Date Due  . MAMMOGRAM  01/28/1997  . COLONOSCOPY  01/28/1997  .  PNA vac Low Risk Adult (2 of 2 - PCV13) 02/08/2013  . INFLUENZA VACCINE  12/07/2015  . DEXA SCAN  Completed    Vitals:   05/23/16 1113  BP: (!) 151/74  Pulse: 80  Resp: 16  Temp: 97.2 F (36.2 C)  TempSrc: Oral  SpO2: 98%  Weight: 258 lb (117 kg)   Body mass index is 44.29 kg/m. Physical Exam  Constitutional: She is oriented to person, place, and time. She appears well-developed and well-nourished.  Obese female resting in bed wearing headphones to listen to books on tape  Cardiovascular: Normal rate, regular rhythm, normal heart sounds and intact distal pulses.   Pulmonary/Chest: Effort normal and breath sounds normal. No respiratory distress.  Few rhonchi remain  Abdominal: Soft. Bowel sounds are normal. She exhibits no distension and no mass. There is no tenderness. There is no rebound and no guarding.  Musculoskeletal: She exhibits tenderness.  Tender in right groin especially during attempts to flex hip  Neurological: She is alert and oriented to person, place, and time. She exhibits abnormal muscle tone.  Weakness of all extremities  Skin:  Left anterior shin venous ulcer with fresh dressing, no surrounding erythema or warmth  Psychiatric: She has a normal mood and affect.    Labs reviewed:  Recent Labs  05/05/16 0400    NA 137  K 4.1  BUN 16  CREATININE 1.0    Recent Labs  01/31/16  AST 21  ALT 18    Recent Labs  05/05/16 0400  WBC 4.7  HGB 13.5  HCT 41  PLT 303   Lab Results  Component Value Date   TSH 0.467 02/07/2012   Lab Results  Component Value Date   HGBA1C 5.9 12/21/2015   No results found for: CHOL, HDL, LDLCALC, LDLDIRECT, TRIG, CHOLHDL  Significant Diagnostic Results in last 30 days:  Dg Tibia/fibula Left  Result Date: 05/03/2016 CLINICAL DATA:  Ulceration lower extremity EXAM: LEFT TIBIA AND FIBULA - 2 VIEW COMPARISON:  None. FINDINGS: Frontal and lateral views were obtained. There is diffuse soft tissue edema. No fracture or dislocation. No abnormal periosteal reaction. No erosive change or bony destruction. 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 No soft tissue abscess evident by radiography. IMPRESSION: Soft tissue edema. Osteoarthritic change ankle joint. No bony destruction. No fracture or dislocation. Electronically Signed   By: Lowella Grip III M.D.   On: 05/03/2016 15:46    Assessment/Plan 1. Pubic ramus fracture, right, with routine healing, subsequent encounter -pain better controlled, but pt remains pretty immobile -encouraged her to get up more with therapy and asked that nursing premedicate her before rehab so she can get up to sit on the edge of the bed and spend more time up in her chair -needs to be able to SAFELY stand and pivot with a single staff member ideally to go home which is her ultimate goal (and go back to work)  2. Venous ulcer of left leg (HCC) -gradually improving -no further evidence of infection -cont to follow with [facility], santyl, daily dressing changes  3. Immobility -cont therapy WITH consistent participation and use of pain meds -on lovenox for DVT prophylaxis and not getting up enough yet to stop this  4. Multiple sclerosis -primary progressive type -she will decide if she wants to continue her current therapy  IV at the 6 mo mark in may -has been worsening over the past several months overall with more weakness and functional decline even prior to the  fall with pelvic fx unfortunately  5. Neurogenic dysfunction of the urinary bladder -with urinary incontinence ongoing, keep clean and dry  6. Acute bronchitis, unspecified organism -resolved  Family/ staff Communication: discussed with neurology, pt, rehab RN, PT  Labs/tests ordered:  No new today

## 2016-05-31 DIAGNOSIS — L97222 Non-pressure chronic ulcer of left calf with fat layer exposed: Secondary | ICD-10-CM | POA: Diagnosis not present

## 2016-06-07 DIAGNOSIS — L97222 Non-pressure chronic ulcer of left calf with fat layer exposed: Secondary | ICD-10-CM | POA: Diagnosis not present

## 2016-06-12 ENCOUNTER — Telehealth: Payer: Self-pay | Admitting: *Deleted

## 2016-06-12 NOTE — Telephone Encounter (Signed)
Dr. Mariea Clonts received [employer] Disability Form [phone] for Right Pelvic Fracture (pubic ramus) short term disability benefits. Filled out and signed and given to [facility].  Incident Number [id number] Plan Number [facility] [id number] Plan Name [facility].  Administered by [place]

## 2016-06-13 ENCOUNTER — Encounter (HOSPITAL_BASED_OUTPATIENT_CLINIC_OR_DEPARTMENT_OTHER): Payer: 59 | Attending: Surgery

## 2016-06-13 DIAGNOSIS — E039 Hypothyroidism, unspecified: Secondary | ICD-10-CM | POA: Diagnosis not present

## 2016-06-13 DIAGNOSIS — E669 Obesity, unspecified: Secondary | ICD-10-CM | POA: Insufficient documentation

## 2016-06-13 DIAGNOSIS — Z853 Personal history of malignant neoplasm of breast: Secondary | ICD-10-CM | POA: Diagnosis not present

## 2016-06-13 DIAGNOSIS — G35 Multiple sclerosis: Secondary | ICD-10-CM | POA: Diagnosis not present

## 2016-06-13 DIAGNOSIS — Z9013 Acquired absence of bilateral breasts and nipples: Secondary | ICD-10-CM | POA: Insufficient documentation

## 2016-06-13 DIAGNOSIS — L97821 Non-pressure chronic ulcer of other part of left lower leg limited to breakdown of skin: Secondary | ICD-10-CM | POA: Diagnosis present

## 2016-06-13 DIAGNOSIS — I890 Lymphedema, not elsewhere classified: Secondary | ICD-10-CM | POA: Diagnosis not present

## 2016-06-13 DIAGNOSIS — Z6841 Body Mass Index (BMI) 40.0 and over, adult: Secondary | ICD-10-CM | POA: Diagnosis not present

## 2016-06-20 DIAGNOSIS — L97821 Non-pressure chronic ulcer of other part of left lower leg limited to breakdown of skin: Secondary | ICD-10-CM | POA: Diagnosis not present

## 2016-06-27 DIAGNOSIS — L97821 Non-pressure chronic ulcer of other part of left lower leg limited to breakdown of skin: Secondary | ICD-10-CM | POA: Diagnosis not present

## 2016-07-04 DIAGNOSIS — L97821 Non-pressure chronic ulcer of other part of left lower leg limited to breakdown of skin: Secondary | ICD-10-CM | POA: Diagnosis not present

## 2016-07-06 ENCOUNTER — Non-Acute Institutional Stay (SKILLED_NURSING_FACILITY): Payer: 59 | Admitting: Adult Health

## 2016-07-06 ENCOUNTER — Encounter: Payer: Self-pay | Admitting: Adult Health

## 2016-07-06 DIAGNOSIS — Z7409 Other reduced mobility: Secondary | ICD-10-CM

## 2016-07-06 DIAGNOSIS — S32591S Other specified fracture of right pubis, sequela: Secondary | ICD-10-CM

## 2016-07-06 DIAGNOSIS — I872 Venous insufficiency (chronic) (peripheral): Secondary | ICD-10-CM | POA: Diagnosis not present

## 2016-07-06 DIAGNOSIS — K5901 Slow transit constipation: Secondary | ICD-10-CM | POA: Diagnosis not present

## 2016-07-06 DIAGNOSIS — N319 Neuromuscular dysfunction of bladder, unspecified: Secondary | ICD-10-CM

## 2016-07-06 DIAGNOSIS — I83029 Varicose veins of left lower extremity with ulcer of unspecified site: Secondary | ICD-10-CM | POA: Diagnosis not present

## 2016-07-06 DIAGNOSIS — K5900 Constipation, unspecified: Secondary | ICD-10-CM | POA: Insufficient documentation

## 2016-07-06 DIAGNOSIS — L97929 Non-pressure chronic ulcer of unspecified part of left lower leg with unspecified severity: Secondary | ICD-10-CM

## 2016-07-06 DIAGNOSIS — E890 Postprocedural hypothyroidism: Secondary | ICD-10-CM

## 2016-07-06 DIAGNOSIS — I10 Essential (primary) hypertension: Secondary | ICD-10-CM | POA: Diagnosis not present

## 2016-07-06 DIAGNOSIS — G35 Multiple sclerosis: Secondary | ICD-10-CM

## 2016-07-06 NOTE — Progress Notes (Signed)
Location:  Occupational psychologist of Service:  SNF (31) Provider:   Cindi Carbon, [place] [phone]   Mathews Argyle, MD  Patient Care Team: Lajean Manes, MD as PCP - General (Internal Medicine)  Extended Emergency Contact Information Primary Emergency Contact: [name] [place] of [place] Phone: [phone] Relation: Daughter  Code Status:  DNR Goals of care: Advanced Directive information Advanced Directives 05/23/2016  Does Patient Have a Medical Advance Directive? Yes  Type of Advance Directive Out of facility DNR (pink MOST or yellow form);Living will;Healthcare Power of Attorney  Does patient want to make changes to medical advance directive? No - Patient declined  Copy of [place] in Chart? Yes  Pre-existing out of facility DNR order (yellow form or pink MOST form) Yellow form placed in chart (order not valid for inpatient use)     Chief Complaint  Patient presents with  . Medical Management of Chronic Issues    HPI:  Pt is a 71 y.o. female seen today for medical management of chronic diseases.  She is residing in rehab at [facility] for physical rehabilitation s/p a fall with subsequent right pubic ramus fracture (noted on xray on 05/08/16). She has progressive MS and is going to see neurology in May to resume therapy for this issue but she questions if it will be beneficial. She reports that her pain has completely gone and she is no longer using percocet. She has constipation and takes a supp every 3rd day. Has neurogenic bladder and wears a brief, denies any change in incontinence. Continues to need the hoyer lift for transfers due to weakness but hopes to progress with therapy and go home in 1-2 weeks. Hx of hypothyroidism noted s/p thyroidectomy (neoplasm)  in 2015.  Currently on synthroid 175 mcg. TSH 05/05/16 1.82 followed by Hal Stoneking. Hx of venous insuff on 40 mg of  lasix qd.  Has left venous ulcer management by wound center. Healing per pt. Wears compression wraps on the left. Weight stable but above goal at 249.8 lbs. BP above goal at times 136-150/67-97.    Past Medical History:  Diagnosis Date  . 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 Cervical atypia   . Fracture of right lower leg 02/2012   NO SURGERY  . Goiter    On synthroid   . History of toxic multinodular goiter   . Hypertension   . Multiple sclerosis ([place])    PRIMARY PROGRESSIVE- NEUROLOGIST IS DR. [place]; -INDEPENDENT LIVING USING POWER CHAIR - DRIVES - WORKS- NOT ABLE TO AMBULATE- CAN PULL UP AND TRANSFER.; WEARS DIAPER - UNABLE TO GET TO BATHROOM.  Marland Kitchen Obesity   . Renal disease    stage 3  . Thyroid nodule   . Tibia/fibula fracture, right, closed, initial encounter    Past Surgical History:  Procedure Laterality Date  . bone spur     removed from rt large toe  . CATARACT EXTRACTION, BILATERAL    . CERVICAL CONIZATION W/BX  1973  . KNEE ARTHROSCOPY W/ MENISCAL REPAIR  2002  . MASTECTOMY, RADICAL  10/08   BILATERAL  ( RIGHT BREAST CANCER )  . THYROIDECTOMY N/A 04/23/2014   Procedure: TOTAL THYROIDECTOMY;  Surgeon: Armandina Gemma, MD;  Location: [facility];  Service: General;  Laterality: N/A;    Allergies  Allergen Reactions  . Losartan Potassium  Decreased GFR, per Dr. Felipa Eth    Allergies as of 07/06/2016      Reactions   Losartan Potassium    Decreased GFR, per Dr. Felipa Eth      Medication List       Accurate as of 07/06/16 11:11 AM. Always use your most recent med list.          aspirin 81 MG tablet Take 81 mg by mouth daily.   bisacodyl 10 MG suppository Commonly known as:  DULCOLAX Place 10 mg rectally daily as needed for moderate constipation.   calcium-vitamin D 500-200 MG-UNIT tablet Take 2 tablets by mouth daily.   chlorpheniramine 4 MG tablet Commonly known as:   CHLOR-TRIMETON Take 4 mg by mouth every 6 (six) hours as needed (congestion).   DELSYM 30 MG/5ML liquid Generic drug:  dextromethorphan Take 10 mLs by mouth 2 (two) times daily as needed for cough.   enoxaparin 40 MG/0.4ML injection Commonly known as:  LOVENOX Inject 40 mg into the skin daily.   furosemide 40 MG tablet Commonly known as:  LASIX Take 40 mg by mouth daily.   ibuprofen 600 MG tablet Commonly known as:  ADVIL,MOTRIN Take 600 mg by mouth every 6 (six) hours as needed.   levothyroxine 175 MCG tablet Commonly known as:  SYNTHROID, LEVOTHROID Take 175 mcg by mouth daily before breakfast.   oxyCODONE-acetaminophen 5-325 MG tablet Commonly known as:  PERCOCET/ROXICET Take 1 tablet by mouth every 6 (six) hours as needed for moderate pain. & 2 tablets by mouth every 6 hours as needed for severe pain   polyethylene glycol packet Commonly known as:  MIRALAX / GLYCOLAX Take 17 g by mouth daily as needed.   RA VITAMIN B-12 TR 1000 MCG Tbcr Generic drug:  Cyanocobalamin Take 1 tablet by mouth daily.   Vitamin D3 2000 units capsule Take 2,000 Units by mouth daily.       Review of Systems  Constitutional: Positive for activity change. Negative for appetite change, chills, diaphoresis, fatigue, fever and unexpected weight change.  HENT: Negative for congestion.   Respiratory: Negative for cough, shortness of breath and wheezing.   Cardiovascular: Positive for leg swelling. Negative for chest pain and palpitations.  Gastrointestinal: Positive for constipation. Negative for abdominal distention, abdominal pain and diarrhea.  Genitourinary: Negative for difficulty urinating and dysuria.       Incontinence with neurogenic bladder  Musculoskeletal: Positive for arthralgias and gait problem. Negative for back pain, joint swelling and myalgias.  Skin: Positive for wound.  Neurological: Positive for weakness. Negative for dizziness, tremors, seizures, syncope, facial  asymmetry, speech difficulty, light-headedness, numbness and headaches.  Psychiatric/Behavioral: Negative for agitation, behavioral problems and confusion.    Immunization History  Administered Date(s) Administered  . Influenza Split 02/09/2012  . Pneumococcal Polysaccharide-23 02/09/2012   Pertinent  Health Maintenance Due  Topic Date Due  . MAMMOGRAM  01/28/1997  . COLONOSCOPY  01/28/1997  . PNA vac Low Risk Adult (2 of 2 - PCV13) 02/08/2013  . INFLUENZA VACCINE  12/07/2015  . DEXA SCAN  Completed   No flowsheet data found. Functional Status Survey:    Vitals:   07/06/16 1100  BP: (!) 142/97  Pulse: 63  Resp: 20  Temp: 98.8 F (37.1 C)  SpO2: 95%  Weight: 249 lb 12.8 oz (113.3 kg)   Body mass index is 42.88 kg/m. Physical Exam  Constitutional: She is oriented to person, place, and time. No distress.  obese  HENT:  Head: Normocephalic and atraumatic.  Neck: No JVD present.  Cardiovascular: Normal rate and regular rhythm.   No murmur heard. Pulmonary/Chest: Effort normal and breath sounds normal. No respiratory distress. She has no wheezes.  Abdominal: Soft. Bowel sounds are normal.  Neurological: She is alert and oriented to person, place, and time.  Skin: Skin is warm and dry. She is not diaphoretic.  Left leg wrapped with compression layers. Right leg with +1 edema.  Mild erythema noted to RLE.    Psychiatric: She has a normal mood and affect.    Labs reviewed:  Recent Labs  05/05/16 0400  NA 137  K 4.1  BUN 16  CREATININE 1.0    Recent Labs  01/31/16  AST 21  ALT 18    Recent Labs  05/05/16 0400  WBC 4.7  HGB 13.5  HCT 41  PLT 303   Lab Results  Component Value Date   TSH 0.467 02/07/2012   Lab Results  Component Value Date   HGBA1C 5.9 12/21/2015   No results found for: CHOL, HDL, LDLCALC, LDLDIRECT, TRIG, CHOLHDL  Significant Diagnostic Results in last 30 days:  No results found.  Assessment/Plan 1. Pubic ramus fracture,  right, sequela Resolved pain D/c percocet from [facility]  2. Immobility Making gains but continues to need PT and OT and needs to be able to transfer by herself to return home  3. Neurogenic dysfunction of the urinary bladder Wears brief, no UTI symptoms, pain etc  4. Venous ulcer of left leg (HCC) Resolving, followed by wound care  5. Multiple sclerosis Followed by neurology Has progressive weakness confounded by obesity and recent pelvic fracture  6. Postoperative hypothyroidism Reports that she will follow up with Hal stoneking at discharge for this issue  7. Essential hypertension Elevated, needs manual BP log x 1 week and report to [facility]  8. Venous insufficiency Elevate legs Order compression hose once venous ulcer heals and wrap d/c'd    Family/ staff Communication: discussed with resident  Labs/tests ordered:  NA, declined any additional labs

## 2016-07-11 ENCOUNTER — Encounter (HOSPITAL_BASED_OUTPATIENT_CLINIC_OR_DEPARTMENT_OTHER): Payer: 59 | Attending: Surgery

## 2016-07-11 DIAGNOSIS — Z09 Encounter for follow-up examination after completed treatment for conditions other than malignant neoplasm: Secondary | ICD-10-CM | POA: Insufficient documentation

## 2016-07-11 DIAGNOSIS — Z872 Personal history of diseases of the skin and subcutaneous tissue: Secondary | ICD-10-CM | POA: Insufficient documentation

## 2016-07-11 DIAGNOSIS — I10 Essential (primary) hypertension: Secondary | ICD-10-CM | POA: Insufficient documentation

## 2016-07-11 DIAGNOSIS — G35 Multiple sclerosis: Secondary | ICD-10-CM | POA: Insufficient documentation

## 2016-07-21 ENCOUNTER — Non-Acute Institutional Stay (SKILLED_NURSING_FACILITY): Payer: 59 | Admitting: Adult Health

## 2016-07-21 DIAGNOSIS — N3000 Acute cystitis without hematuria: Secondary | ICD-10-CM | POA: Diagnosis not present

## 2016-07-21 NOTE — Progress Notes (Signed)
Location:  [facility] of Service:  SNF (31) Provider:   Cindi Carbon, [place] [phone]   Mathews Argyle, MD  Patient Care Team: Lajean Manes, MD as PCP - General (Internal Medicine)  Extended Emergency Contact Information Primary Emergency Contact: [name] [place] Phone: [phone] Relation: Daughter  Code Status:  DNR Goals of care: Advanced Directive information Advanced Directives 05/23/2016  Does Patient Have a Medical Advance Directive? Yes  Type of Advance Directive Out of facility DNR (pink MOST or yellow form);Living will;Healthcare Power of Attorney  Does patient want to make changes to medical advance directive? No - Patient declined  Copy of [place] in Chart? Yes  Pre-existing out of facility DNR order (yellow form or pink MOST form) Yellow form placed in chart (order not valid for inpatient use)     Chief Complaint  Patient presents with  . Acute Visit    dysuria    HPI:  Pt is a 71 y.o. female seen today for an acute visit for burning with urination and frequency. She does not have fever, back or chills. She has a hx of MS and resides in rehab but hopes to return to [facility] soon.  She reports that she knows when she has a UTI and she is certain she has an infection.   Past Medical History:  Diagnosis Date  . 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 Cervical atypia   . Fracture of right lower leg 02/2012   NO SURGERY  . Goiter    On synthroid   . History of toxic multinodular goiter   . Hypertension   . Multiple sclerosis ([place])    PRIMARY PROGRESSIVE- NEUROLOGIST IS DR. Vonore; -INDEPENDENT LIVING USING POWER CHAIR - DRIVES - WORKS- NOT ABLE TO AMBULATE- CAN PULL UP AND TRANSFER.; WEARS DIAPER - UNABLE TO GET TO BATHROOM.  Marland Kitchen Obesity   . Renal disease    stage 3    . Thyroid nodule   . Tibia/fibula fracture, right, closed, initial encounter    Past Surgical History:  Procedure Laterality Date  . bone spur     removed from rt large toe  . CATARACT EXTRACTION, BILATERAL    . CERVICAL CONIZATION W/BX  1973  . KNEE ARTHROSCOPY W/ MENISCAL REPAIR  2002  . MASTECTOMY, RADICAL  10/08   BILATERAL  ( RIGHT BREAST CANCER )  . THYROIDECTOMY N/A 04/23/2014   Procedure: TOTAL THYROIDECTOMY;  Surgeon: Armandina Gemma, MD;  Location: [facility];  Service: General;  Laterality: N/A;    Allergies  Allergen Reactions  . Losartan Potassium     Decreased GFR, per Dr. Felipa Eth    Allergies as of 07/21/2016      Reactions   Losartan Potassium    Decreased GFR, per Dr. Felipa Eth      Medication List       Accurate as of 07/21/16 10:45 AM. Always use your most recent med list.          aspirin 81 MG tablet Take 81 mg by mouth daily.   bisacodyl 10 MG suppository Commonly known as:  DULCOLAX Place 10 mg rectally daily as needed for moderate constipation.   calcium-vitamin D 500-200 MG-UNIT tablet Take 2 tablets by mouth daily.   chlorpheniramine 4 MG tablet Commonly known as:  CHLOR-TRIMETON Take 4 mg by mouth every 6 (  six) hours as needed (congestion).   DELSYM 30 MG/5ML liquid Generic drug:  dextromethorphan Take 10 mLs by mouth 2 (two) times daily as needed for cough.   furosemide 40 MG tablet Commonly known as:  LASIX Take 40 mg by mouth daily.   ibuprofen 600 MG tablet Commonly known as:  ADVIL,MOTRIN Take 600 mg by mouth every 6 (six) hours as needed.   levothyroxine 175 MCG tablet Commonly known as:  SYNTHROID, LEVOTHROID Take 175 mcg by mouth daily before breakfast.   polyethylene glycol packet Commonly known as:  MIRALAX / GLYCOLAX Take 17 g by mouth daily as needed.   RA VITAMIN B-12 TR 1000 MCG Tbcr Generic drug:  Cyanocobalamin Take 1 tablet by mouth daily.   Vitamin D3 2000 units capsule Take 2,000 Units by mouth daily.        Review of Systems  Constitutional: Negative for activity change, appetite change, chills, diaphoresis, fatigue, fever and unexpected weight change.  HENT: Negative for congestion.   Respiratory: Negative for cough, shortness of breath and wheezing.   Cardiovascular: Positive for leg swelling. Negative for chest pain and palpitations.  Gastrointestinal: Positive for constipation. Negative for abdominal distention, abdominal pain and diarrhea.  Genitourinary: Positive for dysuria and frequency. Negative for decreased urine volume, difficulty urinating, flank pain, genital sores, hematuria, pelvic pain, vaginal bleeding and vaginal discharge.  Musculoskeletal: Positive for gait problem. Negative for arthralgias, back pain, joint swelling and myalgias.  Neurological: Negative for dizziness, tremors, seizures, syncope, facial asymmetry, speech difficulty, weakness, light-headedness, numbness and headaches.  Psychiatric/Behavioral: Negative for agitation, behavioral problems and confusion.    Immunization History  Administered Date(s) Administered  . Influenza Split 02/09/2012  . Pneumococcal Polysaccharide-23 02/09/2012   Pertinent  Health Maintenance Due  Topic Date Due  . MAMMOGRAM  01/28/1997  . COLONOSCOPY  01/28/1997  . PNA vac Low Risk Adult (2 of 2 - PCV13) 02/08/2013  . INFLUENZA VACCINE  12/07/2015  . DEXA SCAN  Completed   No flowsheet data found. Functional Status Survey:    Vitals:   07/21/16 1041  BP: 122/67  Pulse: 83  Resp: 16  Temp: 97.7 F (36.5 C)   There is no height or weight on file to calculate BMI. Physical Exam  Constitutional: She is oriented to person, place, and time. No distress.  HENT:  Head: Normocephalic and atraumatic.  Neck: No JVD present.  Cardiovascular: Normal rate and regular rhythm.   No murmur heard. Pulmonary/Chest: Effort normal and breath sounds normal. No respiratory distress. She has no wheezes.  Abdominal: Soft. Bowel  sounds are normal. She exhibits no distension. There is no tenderness.  No CVA tenderness  Neurological: She is alert and oriented to person, place, and time.  Skin: Skin is warm and dry. She is not diaphoretic.  Psychiatric: She has a normal mood and affect.    Labs reviewed:  Recent Labs  05/05/16 0400  NA 137  K 4.1  BUN 16  CREATININE 1.0    Recent Labs  01/31/16  AST 21  ALT 18    Recent Labs  05/05/16 0400  WBC 4.7  HGB 13.5  HCT 41  PLT 303   Lab Results  Component Value Date   TSH 0.467 02/07/2012   Lab Results  Component Value Date   HGBA1C 5.9 12/21/2015   No results found for: CHOL, HDL, LDLCALC, LDLDIRECT, TRIG, CHOLHDL  Significant Diagnostic Results in last 30 days:  No results found.  Assessment/Plan  1) UTI Keflex  500 mg BID for  7 d with florastor 1 cap BID for 7 d after obtaining UA C and S   Family/ staff Communication: discussed with resident  Labs/tests ordered:  UA C and S

## 2016-08-09 ENCOUNTER — Encounter: Payer: Self-pay | Admitting: *Deleted

## 2017-06-01 IMAGING — CR DG TIBIA/FIBULA 2V*L*
2 series · 2 of 2 positions shown · non-contrast
Comparison: None.

CLINICAL DATA: Ulceration lower extremity

EXAM:
LEFT TIBIA AND FIBULA - 2 VIEW

[view not recorded (1 of 2)]
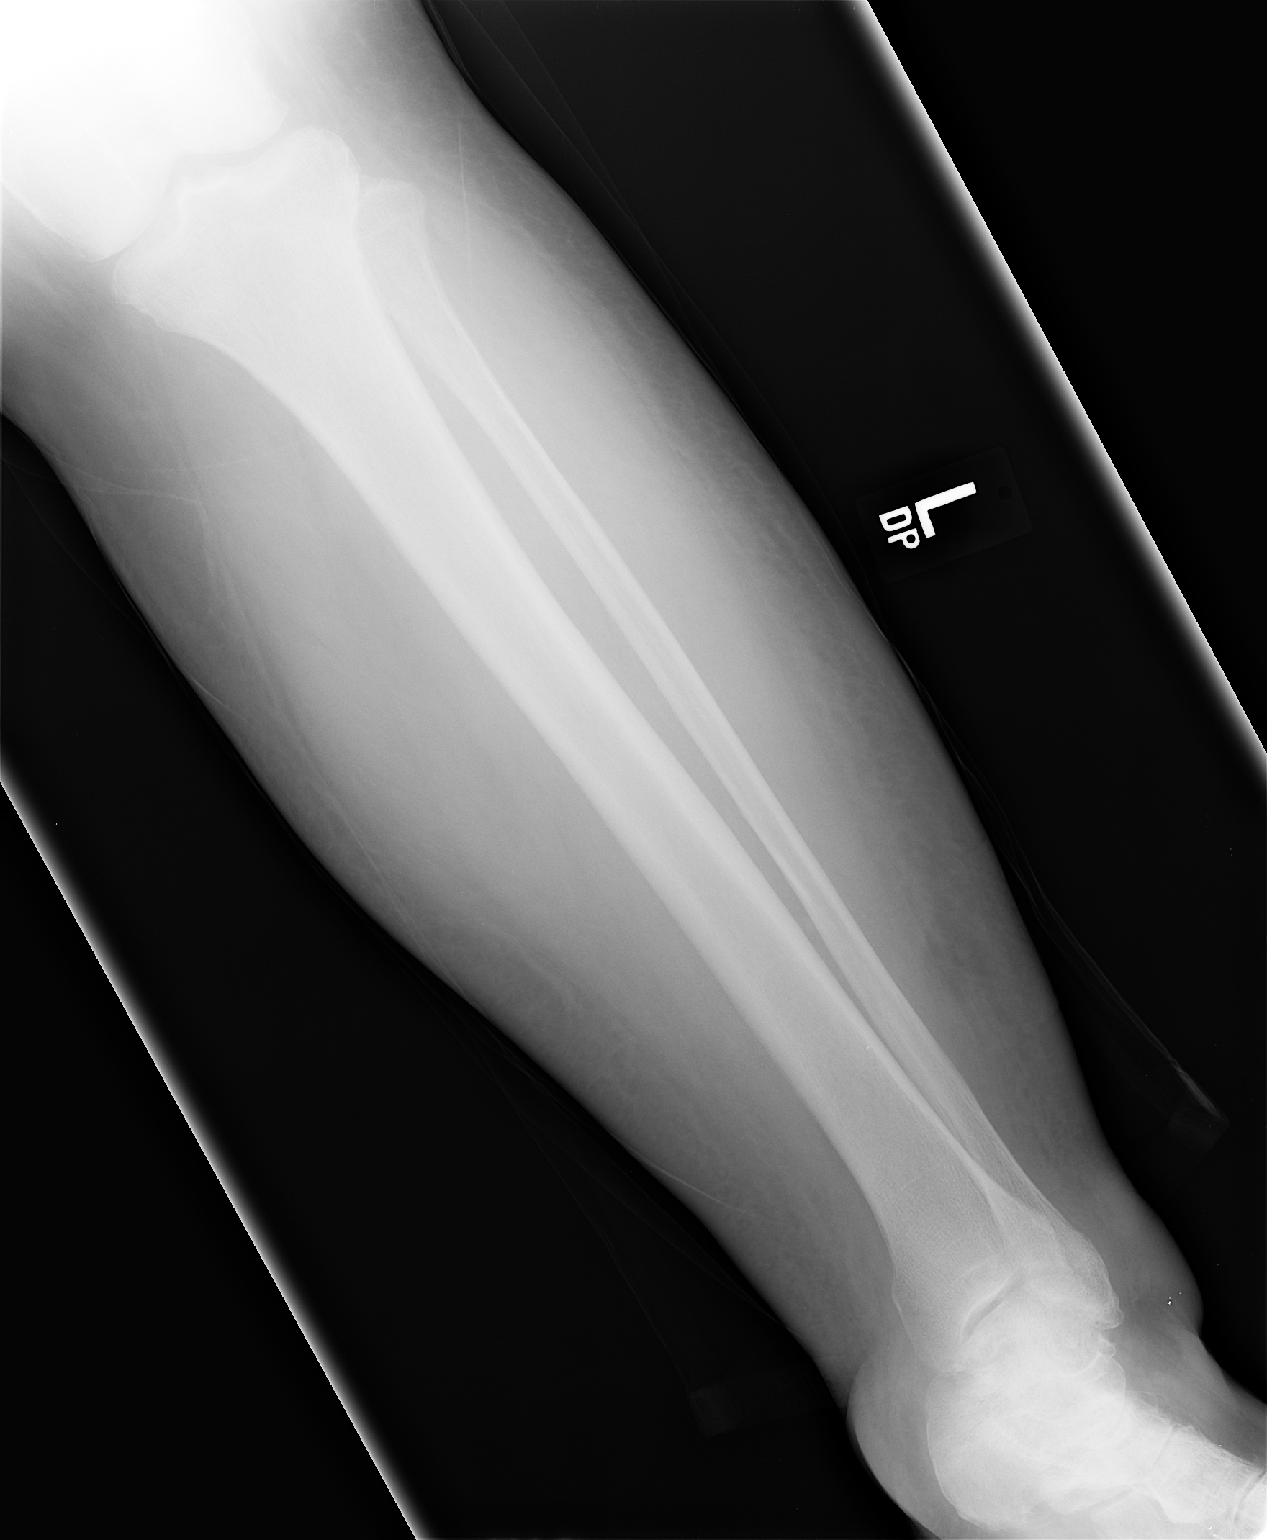

[view not recorded (2 of 2)]
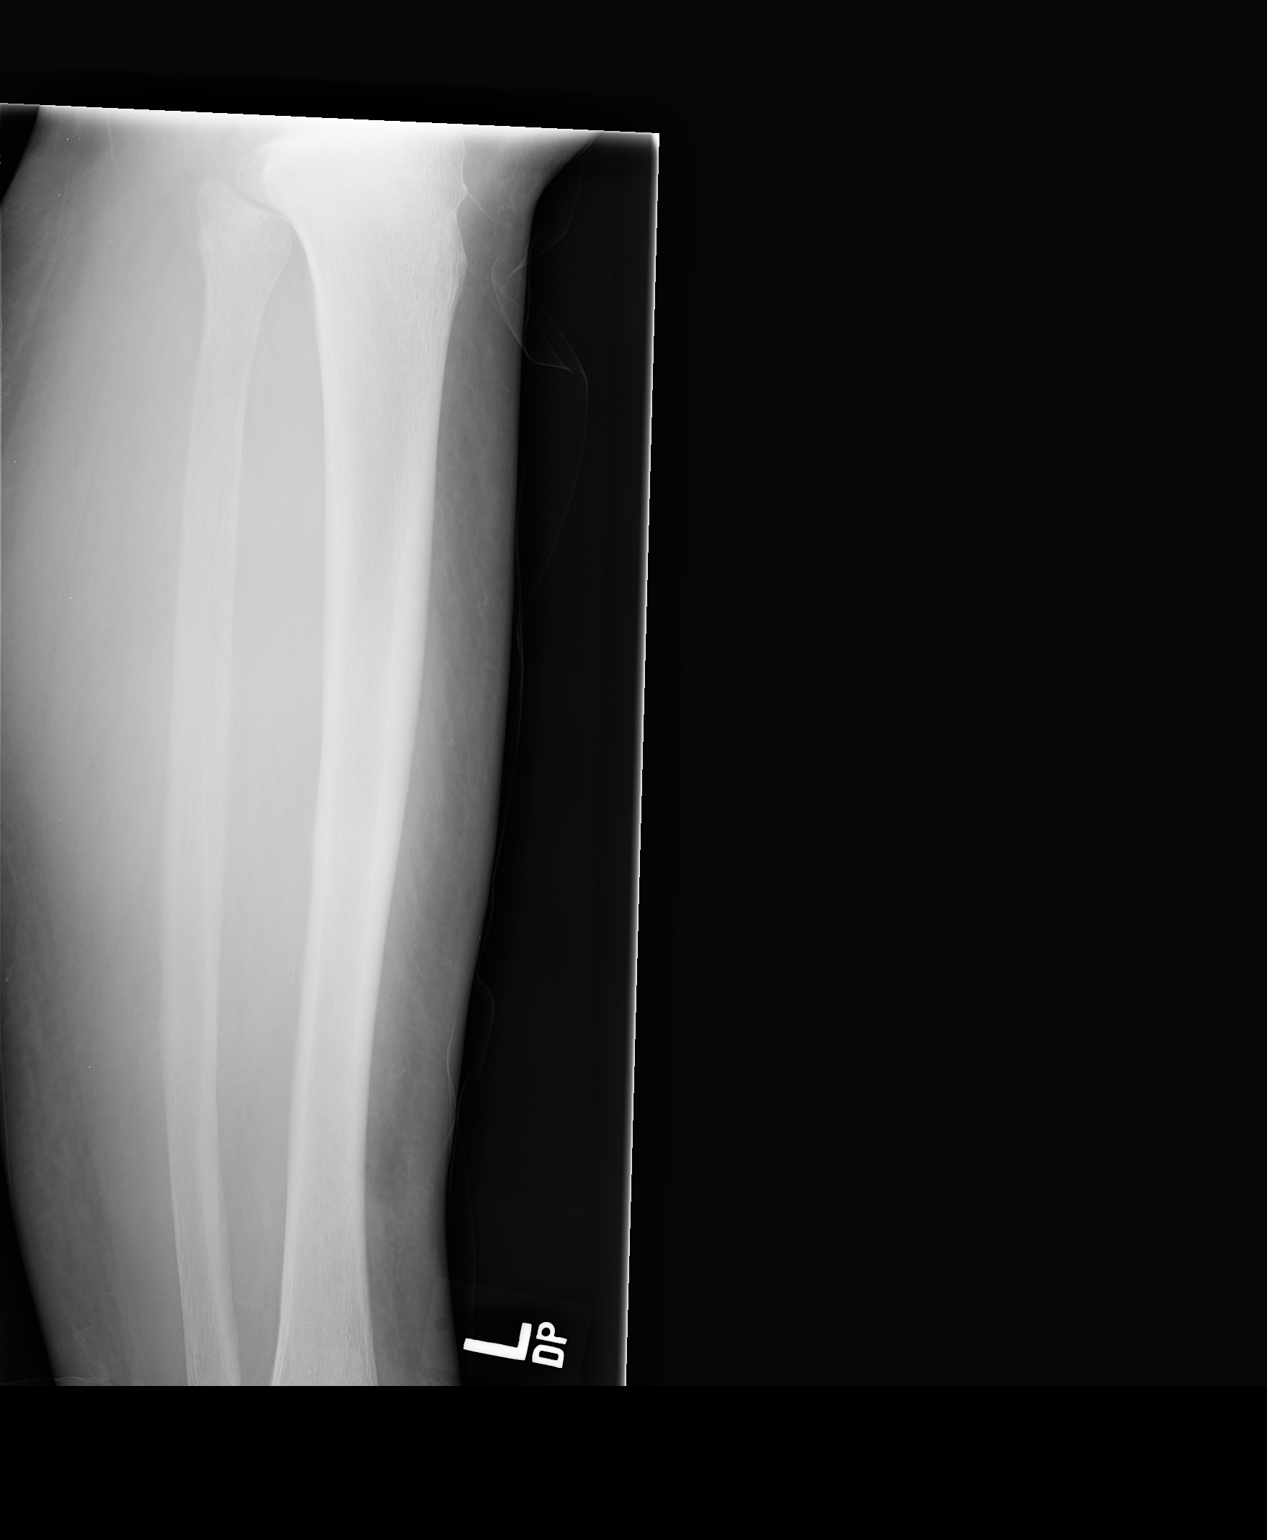

[2 of 2 positions shown; findings below may reference images not displayed]

FINDINGS: Frontal and lateral views were obtained. There is diffuse soft
tissue edema. No fracture or dislocation. No abnormal periosteal
reaction. No erosive change or bony destruction. Osteoarthritic
changes noted in the ankle joint region. No soft tissue abscess
evident by radiography.
IMPRESSION: Soft tissue edema. Osteoarthritic change ankle joint. No bony
destruction. No fracture or dislocation.

## 2019-12-25 ENCOUNTER — Encounter: Payer: Self-pay | Admitting: Genetic Counselor

## 2020-02-23 LAB — BASIC METABOLIC PANEL
BUN: 19 (ref 4–21)
CO2: 28 — AB (ref 13–22)
Chloride: 100 (ref 99–108)
Creatinine: 1.2 — AB (ref 0.5–1.1)
Glucose: 90
Potassium: 4 (ref 3.4–5.3)
Sodium: 139 (ref 137–147)

## 2020-02-23 LAB — HEPATIC FUNCTION PANEL
ALT: 12 (ref 7–35)
AST: 15 (ref 13–35)
Alkaline Phosphatase: 58 (ref 25–125)
Bilirubin, Total: 0.4

## 2020-02-23 LAB — CBC AND DIFFERENTIAL
HCT: 44 (ref 36–46)
Hemoglobin: 14.6 (ref 12.0–16.0)
Platelets: 428 — AB (ref 150–399)
WBC: 7.4

## 2020-02-23 LAB — COMPREHENSIVE METABOLIC PANEL
Albumin: 3.9 (ref 3.5–5.0)
Calcium: 9.7 (ref 8.7–10.7)

## 2020-02-23 LAB — HEMOGLOBIN A1C: Hemoglobin A1C: 6

## 2020-02-23 LAB — TSH: TSH: 0.27 — AB (ref 0.41–5.90)

## 2020-02-23 LAB — CBC: RBC: 5.18 — AB (ref 3.87–5.11)

## 2020-04-20 ENCOUNTER — Non-Acute Institutional Stay (SKILLED_NURSING_FACILITY): Payer: Medicare Other | Admitting: Internal Medicine

## 2020-04-20 ENCOUNTER — Encounter: Payer: Self-pay | Admitting: Internal Medicine

## 2020-04-20 DIAGNOSIS — Z853 Personal history of malignant neoplasm of breast: Secondary | ICD-10-CM

## 2020-04-20 DIAGNOSIS — I872 Venous insufficiency (chronic) (peripheral): Secondary | ICD-10-CM | POA: Diagnosis not present

## 2020-04-20 DIAGNOSIS — G35 Multiple sclerosis: Secondary | ICD-10-CM | POA: Diagnosis not present

## 2020-04-20 DIAGNOSIS — R7303 Prediabetes: Secondary | ICD-10-CM

## 2020-04-20 DIAGNOSIS — N319 Neuromuscular dysfunction of bladder, unspecified: Secondary | ICD-10-CM | POA: Diagnosis not present

## 2020-04-20 DIAGNOSIS — E890 Postprocedural hypothyroidism: Secondary | ICD-10-CM

## 2020-04-20 DIAGNOSIS — K5901 Slow transit constipation: Secondary | ICD-10-CM | POA: Diagnosis not present

## 2020-04-20 DIAGNOSIS — I129 Hypertensive chronic kidney disease with stage 1 through stage 4 chronic kidney disease, or unspecified chronic kidney disease: Secondary | ICD-10-CM

## 2020-04-20 DIAGNOSIS — Z66 Do not resuscitate: Secondary | ICD-10-CM

## 2020-04-20 DIAGNOSIS — Z6841 Body Mass Index (BMI) 40.0 and over, adult: Secondary | ICD-10-CM

## 2020-04-20 DIAGNOSIS — Z7409 Other reduced mobility: Secondary | ICD-10-CM

## 2020-04-20 NOTE — Progress Notes (Signed)
Provider:  Rexene Edison. Mariea Clonts, D.O., C.M.D. Location:  [place] Room Number: 157-A Place of Service:  SNF (31)  PCP: Lajean Manes, MD Patient Care Team: Lajean Manes, MD as PCP - General (Internal Medicine)  Extended Emergency Contact Information Primary Emergency Contact: [name] [place] Phone: [phone] Relation: Daughter  Code Status: DNR  Goals of Care: Advanced Directive information Advanced Directives 04/20/2020  Does Patient Have a Medical Advance Directive? Yes  Type of Paramedic of [place];Living will;Out of facility DNR (pink MOST or yellow form)  Does patient want to make changes to medical advance directive? No - Patient declined  Copy of [place] in Chart? Yes - validated most recent copy scanned in chart (See row information)  Pre-existing out of facility DNR order (yellow form or pink MOST form) Yellow form placed in chart (order not valid for inpatient use)      Chief Complaint  Patient presents with  . New Admit To SNF    New Admission to Rehab, multiple sclerosis, declining, and unable to manage at home.     HPI: Patient is a 74 y.o. female seen today for admission to rehab with long-term plan to move to skilled care.  Manuela Schwartz has a history of primary progressive multiple sclerosis followed by Dr. Ala Bent at [facility], osteoporosis, anemia, CAD, breast cancer, hypertensive nephropathy with CKD3, prediabetes among others.  She had been living in independent living with caregiver assistance; however, she reports that caregivers complained of hurting their backs helping her and no longer wanted to do their jobs so she was forced to move into health care.  She has for some time now used a grab bar in the bathroom to pull herself up while caregivers had helped her with incontinence care, bathing, dressing; however, this was felt to no longer be a  safe practice in her home.  She is not ambulatory.  Since coming to rehab, she's angry and upset that her independence is being taken away even more.  She also has no privacy with frequent interruptions when she's watching a show, moving or reading.  She no longer works at [employer] for [employer] since the pandemic.  She misses this work.  She has been refusing to be changed or checked on a regular schedule and sits in her wet depends.  She is also unhappy about being transferred with a hoyer lift and changed or provided care in the lift.  She talks about losing the remaining strength she does have if this is done.    When I went to visit her and knocked on the door, she yelled, "what?" in a disgruntled voice.    Past Medical History:  Diagnosis Date  . Acute bronchitis    Per [facility] ([facility] electronic [facility] records system)   . Age-related osteoporosis without current pathological fracture    Per [facility] ([facility])   . Anemia, unspecified    Per [facility] ([facility] records [facility])   . Atherosclerotic heart disease of 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    Per [facility] ([facility] records [facility])   . 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 Cervical atypia   . Constipation    Per [facility] ([facility] records [facility])   . Edema    Per [facility] ([facility] records [facility])   . Fracture of right lower leg  02/2012   NO SURGERY  . Goiter    On synthroid   . History of toxic multinodular goiter   . Hypertension   . Hypertensive nephropathy    Per [facility] ([facility])   . Hypothyroidism, unspecified    Per [facility] electronic medical records [facility])   . Multiple sclerosis ([place])    PRIMARY PROGRESSIVE- NEUROLOGIST IS DR. Rockwood; -INDEPENDENT  LIVING USING POWER CHAIR - DRIVES - WORKS- NOT ABLE TO AMBULATE- CAN PULL UP AND TRANSFER.; WEARS DIAPER - UNABLE TO GET TO BATHROOM.  Marland Kitchen Obesity   . Other specified fracture of right pubis, initial encounter for closed fracture ([place])    Per [facility] ([facility] records [facility])   . Prediabetes    Per [facility] ([facility])   . Renal disease    stage 3  . Thyroid nodule   . Tibia/fibula fracture, right, closed, initial encounter   . 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 Vitamin D deficiency    Per [facility] education [facility] electronic medical records system)    Past Surgical History:  Procedure Laterality Date  . bone spur     removed from rt large toe  . CATARACT EXTRACTION, BILATERAL    . CERVICAL CONIZATION W/BX  1973  . KNEE ARTHROSCOPY W/ MENISCAL REPAIR  2002  . MASTECTOMY, RADICAL  10/08   BILATERAL  ( RIGHT BREAST CANCER )  . THYROIDECTOMY N/A 04/23/2014   Procedure: TOTAL THYROIDECTOMY;  Surgeon: Armandina Gemma, MD;  Location: [facility];  Service: General;  Laterality: N/A;    Social History   Socioeconomic History  . Marital status: Married    Spouse name: Not on file  . Number of children: 2  . Years of education: Not on file  . Highest education level: Not on file  Occupational History  . Occupation: program associate  Tobacco Use  . Smoking status: Former Smoker    Types: Cigarettes    Quit date: 05/08/1968    Years since quitting: 51.9  . Smokeless tobacco: Never Used  Vaping Use  . Vaping Use: Never used  Substance and Sexual Activity  . Alcohol use: Yes    Alcohol/week: 7.0 standard drinks    Types: 7 Shots of liquor per week    Comment: [place]  . Drug use: No  . Sexual activity: Not Currently  Other Topics Concern  . Not on file  Social History Narrative   1 daughter in [place] and 1 daughter in [place] Strain: Not on file  Food Insecurity: Not on file  Transportation Needs: Not on file  Physical Activity: Not on file  Stress: Not on file  Social Connections: Not on file    reports that she quit smoking about 51 years ago. Her smoking use included cigarettes. She has never used smokeless tobacco. She reports current alcohol use of about 7.0 standard drinks of alcohol per week. She reports that she does not use drugs.  Functional Status Survey:    Family History  Problem Relation Age of Onset  . Heart disease Mother   . Cancer Father        prostate cancer  . Heart disease Sister   . Cancer Brother        prostate cancer  Health Maintenance  Topic Date Due  . Hepatitis C Screening  Never done  . MAMMOGRAM  Never done  . COLONOSCOPY  Never done  . PNA vac Low Risk Adult (2 of 2 - PCV13) 02/08/2013  . COVID-19 Vaccine (4 - Booster for Moderna series) 09/21/2020  . TETANUS/TDAP  06/08/2022  . INFLUENZA VACCINE  Completed  . DEXA SCAN  Completed    Allergies  Allergen Reactions  . Losartan Potassium     Decreased GFR, per Dr. Felipa Eth    Outpatient Encounter Medications as of 04/20/2020  Medication Sig  . ascorbic acid (VITAMIN C) 500 MG tablet Take 500 mg by mouth daily.  . Calcium Carb-Cholecalciferol (CALCIUM-VITAMIN D) 500-200 MG-UNIT tablet Take 1 tablet by mouth daily.  . Cyanocobalamin 1000 MCG TBCR Take 1 tablet by mouth daily.   . furosemide (LASIX) 40 MG tablet Take 40 mg by mouth daily.  Marland Kitchen levothyroxine (SYNTHROID) 150 MCG tablet Take 150 mcg by mouth daily before breakfast.  . nystatin (NYSTATIN) powder Apply 1 application topically 2 (two) times daily. Apply to reddened areas bilateral groin and inner thighs  . [DISCONTINUED] aspirin 81 MG tablet Take 81 mg by mouth daily.   . [DISCONTINUED] bisacodyl (DULCOLAX) 10 MG suppository Place 10 mg rectally daily as needed for moderate constipation.  . [DISCONTINUED]  chlorpheniramine (CHLOR-TRIMETON) 4 MG tablet Take 4 mg by mouth every 6 (six) hours as needed (congestion).  . [DISCONTINUED] Cholecalciferol (VITAMIN D3) 2000 units capsule Take 2,000 Units by mouth daily.   . [DISCONTINUED] dextromethorphan (DELSYM) 30 MG/5ML liquid Take 10 mLs by mouth 2 (two) times daily as needed for cough.  . [DISCONTINUED] ibuprofen (ADVIL,MOTRIN) 600 MG tablet Take 600 mg by mouth every 6 (six) hours as needed.  . [DISCONTINUED] levothyroxine (SYNTHROID, LEVOTHROID) 175 MCG tablet Take 175 mcg by mouth daily before breakfast.   . [DISCONTINUED] polyethylene glycol (MIRALAX / GLYCOLAX) packet Take 17 g by mouth daily as needed.    No facility-administered encounter medications on file as of 04/20/2020.    Review of Systems  Constitutional: Negative for chills, fever and malaise/fatigue.  HENT: Negative for congestion and sore throat.   Eyes: Negative for blurred vision.  Respiratory: Negative for cough and shortness of breath.   Cardiovascular: Positive for leg swelling. Negative for chest pain and palpitations.  Gastrointestinal: Negative for abdominal pain, blood in stool, constipation, diarrhea and melena.  Genitourinary: Negative for dysuria.       Urinary incontinence, uses depends  Musculoskeletal: Negative for back pain, falls and joint pain.       Uses power scooter  Skin: Negative for itching and rash.  Neurological: Positive for weakness. Negative for dizziness and loss of consciousness.       Left side weakness  Endo/Heme/Allergies: Does not bruise/bleed easily.  Psychiatric/Behavioral: Negative for depression and memory loss. The patient is not nervous/anxious and does not have insomnia.        Grieving loss of independence    Vitals:   04/20/20 1518  BP: 133/79  Pulse: 76  Resp: 17  Temp: 98.2 F (36.8 C)  SpO2: 94%  Weight: 250 lb (113.4 kg)  Height: 5\' 4"  (1.626 m)   Body mass index is 42.91 kg/m. Physical Exam Vitals reviewed.   Constitutional:      General: She is not in acute distress.    Appearance: Normal appearance. She is obese.  HENT:     Head: Normocephalic and atraumatic.     Right Ear: External  ear normal.     Left Ear: External ear normal.     Nose: Nose normal.     Mouth/Throat:     Mouth: Mucous membranes are dry.  Eyes:     Conjunctiva/sclera: Conjunctivae normal.     Pupils: Pupils are equal, round, and reactive to light.  Cardiovascular:     Rate and Rhythm: Normal rate and regular rhythm.     Pulses: Normal pulses.     Heart sounds: Normal heart sounds.  Pulmonary:     Effort: Pulmonary effort is normal.     Breath sounds: Normal breath sounds. No wheezing, rhonchi or rales.  Abdominal:     General: Bowel sounds are normal.     Palpations: Abdomen is soft.     Tenderness: There is no abdominal tenderness. There is no guarding or rebound.  Musculoskeletal:     Cervical back: Neck supple.     Right lower leg: Edema present.     Left lower leg: Edema present.  Skin:    Coloration: Skin is pale.  Neurological:     Mental Status: She is alert.     Cranial Nerves: No cranial nerve deficit.     Motor: Weakness present.     Comments: 3/5 LLE strength, 4/5 LUE, 5/5 on right; uses power scooter  Psychiatric:     Comments: Angry and unhappy     Labs reviewed: Basic Metabolic Panel: No results for input(s): NA, K, CL, CO2, GLUCOSE, BUN, CREATININE, CALCIUM, MG, PHOS in the last 8760 hours. Liver Function Tests: No results for input(s): AST, ALT, ALKPHOS, BILITOT, PROT, ALBUMIN in the last 8760 hours. No results for input(s): LIPASE, AMYLASE in the last 8760 hours. No results for input(s): AMMONIA in the last 8760 hours. CBC: No results for input(s): WBC, NEUTROABS, HGB, HCT, MCV, PLT in the last 8760 hours. Cardiac Enzymes: No results for input(s): CKTOTAL, CKMB, CKMBINDEX, TROPONINI in the last 8760 hours. BNP: Invalid input(s): POCBNP Lab Results  Component Value Date   HGBA1C  5.9 12/21/2015   Lab Results  Component Value Date   TSH 0.467 02/07/2012   No results found for: VITAMINB12 No results found for: FOLATE No results found for: IRON, TIBC, FERRITIN  Imaging and Procedures obtained prior to SNF admission: DG Tibia/Fibula Left  Result Date: 05/03/2016 CLINICAL DATA:  Ulceration lower extremity EXAM: LEFT TIBIA AND FIBULA - 2 VIEW COMPARISON:  None. FINDINGS: Frontal and lateral views were obtained. There is diffuse soft tissue edema. No fracture or dislocation. No abnormal periosteal reaction. No erosive change or bony destruction. 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 No soft tissue abscess evident by radiography. IMPRESSION: Soft tissue edema. Osteoarthritic change ankle joint. No bony destruction. No fracture or dislocation. Electronically Signed   By: Lowella Grip III M.D.   On: 05/03/2016 15:46   ABI WITH/WO TBI  Result Date: 05/03/2016                     *[facility]*                         [address]                        [place], [place] [address]  [phone] ------------------------------------------------------------------- Noninvasive Vascular Lab Lower Extremity Arterial Evaluation Patient:    Verdella, Laidlaw MR #:       [id number] Study Date: 05/03/2016 Gender:     F Age:        52 Height: Weight: BSA: Pt. Status: Room:  ATTENDING    Default, Provider [address] [place]  Ermalene Postin [id number]  SONOGRAPHER  Toma Copier, RVS Reports also to: ------------------------------------------------------------------- History and indications: Indications 729.5 Pain in limb.  History Diagnostic evaluation. Venois ulcer on the anterior distal shin.  ------------------------------------------------------------------- Study information: Study status:  Routine.  Procedure:  A vascular evaluation was performed.  Image quality was adequate.    ABI.     Segmental pressure measurements, ankle-brachial index, and photoplethysmography.  Birthdate:  Patient birthdate: Nov 04, 1945. Age:  Patient is 74 yr old.  Sex:  Gender: female.  Study date: Study date: 05/03/2016. Study time: 02:09 PM.  Location:  Vascular laboratory.  Patient status:  Outpatient. Physical exam:  Unable to obtain the right brachial pressure due to lymphedema. Brachial pressures: +--------+----+---+ !        !Left!Max! +--------+----+---+ !Systolic!160 !702! +--------+----+---+ Arterial pressure indices: +-----------------+---------+--------------+---------+ !Location         !Pressure !Brachial index!Waveform ! +-----------------+---------+--------------+---------+ !Right dorsal ped !168 mm Hg!1.05          !Triphasic! +-----------------+---------+--------------+---------+ !Right post tibial!158 mm Hg!0.99          !Triphasic! +-----------------+---------+--------------+---------+ !Left dorsal ped  !255 mm Hg!--------------!Triphasic! +-----------------+---------+--------------+---------+ !Left post tibial !203 mm Hg!1.27          !Triphasic! +-----------------+---------+--------------+---------+ ------------------------------------------------------------------- Summary: - Right - ABI and Doppler waveforms indicate normal arterial flow   at rest. - Left ABI could not be ascertained due to nn compressible arteries   however Doppler waveforms were within normal limits at rest. Other specific details can be found in the table(s) above. Prepared and Electronically Authenticated by Servando Snare, MD 2017-12-27T17:34:21   Assessment/Plan 1. Primary chronic progressive multiple sclerosis (HCC) -not on medications at this time -has had some decreased strength over time and needs therapy assessment to determine what she can safely do vs use of devices for help (hoyer) -she is not happy with this and does not want therapy -explained risks of skin breakdown and  wounds if she does not permit regular incontinence care also and that we need to build a care plan for her in SNF that is safe for her current status while trying our best to maintain as much independence and privacy as possible  2. Venous insufficiency -sits in her power chair with feet down and has h/o ulceration on file -recommend at least a couple hour break with feet elevated in bed -cont lasix  3. Slow transit constipation -not on regular regimen right now  4. Neurogenic dysfunction of the urinary bladder -cont regular changing of adult undergarments and good hygiene practices to prevent skin breakdown and infections  5. Immobility -needs PT, OT assessment to determine care plan more fully to meet her needs  6. Hypertensive nephropathy, stage 1-4 or unspecified chronic kidney disease -bp ok today, monitor  7. Postoperative hypothyroidism -cont current levothyroxine Lab Results  Component Value Date   TSH 0.27 (A) 02/23/2020  -repeat tsh due to recent change in med in October by Dr. Felipa Eth to see if dose is satisfactory  8. Prediabetes -ideally should follow low carb diet Lab Results  Component Value Date   HGBA1C 6.0  02/23/2020    9. History of breast cancer -bilateral mastectomy so not a candidate for mammograms  10. Class 3 severe obesity with serious comorbidity and body mass index (BMI) of 40.0 to 44.9 in adult, unspecified obesity type ([place]) -with immobility, reports not finishing meals b/c she's not hungry much of the time -monitor  11. Do not resuscitate - Do not attempt resuscitation (DNR)  Family/ staff Communication: left voicemail for Dr. Shelia Media  Labs/tests ordered:  F/u TSH  Trigo Winterbottom L. Suan Pyeatt, D.O. [facility] [address], [place] [address] Cell Phone (Mon-Fri 8am-5pm):  [phone] On Call:  [phone] & follow prompts after 5pm & weekends Office Phone:  [phone] Office Fax:   [phone]

## 2020-04-21 ENCOUNTER — Encounter: Payer: Self-pay | Admitting: Internal Medicine

## 2020-04-27 DIAGNOSIS — Z853 Personal history of malignant neoplasm of breast: Secondary | ICD-10-CM | POA: Insufficient documentation

## 2020-04-27 DIAGNOSIS — Z66 Do not resuscitate: Secondary | ICD-10-CM | POA: Insufficient documentation

## 2020-04-27 DIAGNOSIS — R7303 Prediabetes: Secondary | ICD-10-CM | POA: Insufficient documentation

## 2020-04-27 DIAGNOSIS — Z6841 Body Mass Index (BMI) 40.0 and over, adult: Secondary | ICD-10-CM | POA: Insufficient documentation

## 2020-04-27 DIAGNOSIS — I129 Hypertensive chronic kidney disease with stage 1 through stage 4 chronic kidney disease, or unspecified chronic kidney disease: Secondary | ICD-10-CM | POA: Insufficient documentation

## 2020-07-01 ENCOUNTER — Encounter: Payer: Self-pay | Admitting: Adult Health

## 2020-07-01 ENCOUNTER — Non-Acute Institutional Stay (SKILLED_NURSING_FACILITY): Payer: Medicare Other | Admitting: Adult Health

## 2020-07-01 DIAGNOSIS — E890 Postprocedural hypothyroidism: Secondary | ICD-10-CM

## 2020-07-01 DIAGNOSIS — I10 Essential (primary) hypertension: Secondary | ICD-10-CM | POA: Diagnosis not present

## 2020-07-01 DIAGNOSIS — I872 Venous insufficiency (chronic) (peripheral): Secondary | ICD-10-CM

## 2020-07-01 DIAGNOSIS — G35 Multiple sclerosis: Secondary | ICD-10-CM | POA: Diagnosis not present

## 2020-07-01 DIAGNOSIS — F329 Major depressive disorder, single episode, unspecified: Secondary | ICD-10-CM

## 2020-07-01 NOTE — Progress Notes (Signed)
Location:  Occupational psychologist of Service:  SNF (31) Provider:   Cindi Carbon, ANP [place] [phone]   Gayland Curry, DO  Patient Care Team: Gayland Curry, DO as PCP - General (Geriatric Medicine) Royal Hawthorn, NP as Nurse Practitioner (Nurse Practitioner)  Extended Emergency Contact Information Primary Emergency Contact: [name] [place] Phone: [phone] Relation: Daughter  Code Status:  DNR Goals of care: Advanced Directive information Advanced Directives 04/20/2020  Does Patient Have a Medical Advance Directive? Yes  Type of Paramedic of [place];Living will;Out of facility DNR (pink MOST or yellow form)  Does patient want to make changes to medical advance directive? No - Patient declined  Copy of [place] in Chart? Yes - validated most recent copy scanned in chart (See row information)  Pre-existing out of facility DNR order (yellow form or pink MOST form) Yellow form placed in chart (order not valid for inpatient use)     Chief Complaint  Patient presents with  . Medical Management of Chronic Issues    HPI:  Pt is a 75 y.o. female seen today for medical management of chronic diseases.    Ms. Deeg moved to skilled care after progression in MS with weakness. No requiring a hoyer lift and incontinence care. The nursing notes indicate that she refuses personal care at times.  She is easily angered. Stays in her room all the time and does not converse with staff. She reports that she is sleeping well and her appetite is fair. She has lost a few lbs since arriving the skilled care unit.   She reports that she is still very angry about the loss of independence. She misses going to work and living in [place].  She is easily angered and does not treat the staff with respect.   BPs reviewed in matrix are controlled  She has a bruise on her left calf and they  are not sure where it came from but she is in no pain.    Past Medical History:  Diagnosis Date  . Acute bronchitis    Per Matrix ([facility] electronic [facility] records system)   . Age-related osteoporosis without current pathological fracture    Per [facility] ([facility] records [facility])   . Anemia, unspecified    Per [facility] ([facility] electronic [facility] records system)   . Atherosclerotic heart disease of 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    Per [facility] ([facility] records system)   . Breast cancer ([place]) 10/08   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  . Cervical atypia   . Cervical atypia    Per records received from [facility]   . Constipation    Per [facility] ([facility] records system)   . Edema    Per [facility] ([facility] records [facility])   . Fracture of right lower leg 02/2012   NO SURGERY  . Goiter    On synthroid   . History of toxic multinodular goiter   . Hypertension   . Hypertensive nephropathy    Per [facility] ([facility] records [facility])   . Hypothyroidism, unspecified    Per Matrix [facility] education [facility] electronic medical records system)   . Multinodular goiter    Per records received from [facility]  . Multiple sclerosis ([place])    PRIMARY PROGRESSIVE- NEUROLOGIST IS DR. Renningers; -INDEPENDENT LIVING USING POWER CHAIR -  DRIVES - WORKS- NOT ABLE TO AMBULATE- CAN PULL UP AND TRANSFER.; WEARS DIAPER - UNABLE TO GET TO BATHROOM.  Marland Kitchen Obesity   . Other specified fracture of right pubis, initial encounter for closed fracture ([place])    Per [facility] ([facility] electronic [facility] records system)   . Prediabetes    Per [facility] ([facility] records system)   . Renal disease    stage 3  . Stage 3 chronic kidney disease ([place])    Per records received from [facility]  . Thyroid nodule   . Tibia/fibula  fracture, right, closed, initial encounter   . 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 Vitamin D deficiency    Per Matrix [facility] education officer electronic medical records system)    Past Surgical History:  Procedure Laterality Date  . bone spur     removed from rt large toe  . BREAST LUMPECTOMY Right 12/06/2006   Per records received form [facility]   . CATARACT EXTRACTION, BILATERAL    . CERVICAL CONIZATION W/BX  1973  . KNEE ARTHROSCOPY W/ MENISCAL REPAIR  2002  . MASTECTOMY, RADICAL  10/08   BILATERAL  ( RIGHT BREAST CANCER )  . THYROIDECTOMY N/A 04/23/2014   Procedure: TOTAL THYROIDECTOMY;  Surgeon: Armandina Gemma, MD;  Location: [facility] ORS;  Service: General;  Laterality: N/A;  . [place]   Per records received form [facility]     Allergies  Allergen Reactions  . Losartan Potassium     Decreased GFR, per Dr. Felipa Eth    Outpatient Encounter Medications as of 07/01/2020  Medication Sig  . ascorbic acid (VITAMIN C) 500 MG tablet Take 500 mg by mouth daily.  . Calcium Carb-Cholecalciferol (CALCIUM-VITAMIN D) 500-200 MG-UNIT tablet Take 1 tablet by mouth daily.  . Cyanocobalamin 1000 MCG TBCR Take 1 tablet by mouth daily.   . furosemide (LASIX) 40 MG tablet Take 40 mg by mouth daily.  Marland Kitchen levothyroxine (SYNTHROID) 150 MCG tablet Take 150 mcg by mouth daily before breakfast.  . [DISCONTINUED] nystatin (NYSTATIN) powder Apply 1 application topically 2 (two) times daily. Apply to reddened areas bilateral groin and inner thighs   No facility-administered encounter medications on file as of 07/01/2020.    Review of Systems  Constitutional: Negative for activity change, appetite change, chills, diaphoresis, fatigue, fever and unexpected weight change.  HENT: Negative for congestion.   Respiratory: Negative for cough, shortness of breath and wheezing.   Cardiovascular: Positive for leg  swelling. Negative for chest pain and palpitations.  Gastrointestinal: Negative for abdominal distention, abdominal pain, constipation and diarrhea.  Genitourinary: Negative for difficulty urinating and dysuria.       Incontinence   Musculoskeletal: Positive for gait problem. Negative for arthralgias, back pain, joint swelling and myalgias.  Neurological: Positive for weakness. Negative for dizziness, tremors, seizures, syncope, facial asymmetry, speech difficulty, light-headedness, numbness and headaches.  Psychiatric/Behavioral: Positive for behavioral problems and dysphoric mood. Negative for agitation, confusion, self-injury, sleep disturbance and suicidal ideas.       Anger    Immunization History  Administered Date(s) Administered  . Influenza Split 02/09/2012  . Influenza, High Dose Seasonal PF 02/14/2019, 02/27/2020  . Influenza-Unspecified 01/25/2017, 02/02/2018  . Moderna Sars-Covid-2 Vaccination 05/20/2019, 06/18/2019, 03/24/2020  . Pneumococcal Conjugate-13 07/28/2013  . Pneumococcal Polysaccharide-23 03/20/2007, 02/09/2012  . Tdap 11/27/2002  . Tetanus 06/08/2012  . Zoster 07/06/2012   Pertinent  Health Maintenance Due  Topic Date Due  . INFLUENZA VACCINE  Completed  . DEXA SCAN  Completed  . PNA vac Low Risk Adult  Completed   No flowsheet data found. Functional Status Survey:    Vitals:   07/01/20 1014  Weight: 235 lb 6.4 oz (106.8 kg)   Body mass index is 40.41 kg/m.  Wt Readings from Last 3 Encounters:  07/01/20 235 lb 6.4 oz (106.8 kg)  04/20/20 250 lb (113.4 kg)  07/06/16 249 lb 12.8 oz (113.3 kg)    Physical Exam Vitals and nursing note reviewed.  Constitutional:      General: She is not in acute distress.    Appearance: She is not diaphoretic.  HENT:     Head: Normocephalic and atraumatic.  Neck:     Vascular: No JVD.  Cardiovascular:     Rate and Rhythm: Normal rate and regular rhythm.     Heart sounds: No murmur heard.   Pulmonary:      Effort: Pulmonary effort is normal. No respiratory distress.     Breath sounds: Normal breath sounds. No wheezing.  Abdominal:     General: Bowel sounds are normal. There is no distension.     Palpations: Abdomen is soft.     Tenderness: There is no abdominal tenderness.  Musculoskeletal:     Right lower leg: Edema (+2) present.     Left lower leg: Edema (+2) present.  Skin:    General: Skin is warm and dry.  Neurological:     Mental Status: She is alert and oriented to person, place, and time.  Psychiatric:     Comments: angry     Labs reviewed: Recent Labs    02/23/20 0000  NA 139  K 4.0  CL 100  CO2 28*  BUN 19  CREATININE 1.2*  CALCIUM 9.7   Recent Labs    02/23/20 0000  AST 15  ALT 12  ALKPHOS 58  ALBUMIN 3.9   Recent Labs    02/23/20 0000  WBC 7.4  HGB 14.6  HCT 44  PLT 428*   Lab Results  Component Value Date   TSH 0.27 (A) 02/23/2020   Lab Results  Component Value Date   HGBA1C 6.0 02/23/2020   No results found for: CHOL, HDL, LDLCALC, LDLDIRECT, TRIG, CHOLHDL  Significant Diagnostic Results in last 30 days:  No results found.  Assessment/Plan  1. Postoperative hypothyroidism Lab Results  Component Value Date   TSH 0.27 (A) 02/23/2020   Needs f/u TSH Currently continues on Synthroid 150 mcg qd   2. Primary chronic progressive multiple sclerosis ([place]) Progressively weaker over time and now requires skilled care Not currently on meds due to lack of benefit.   3. Venous insufficiency Continue lasix 40 mg qd Recommend leg elevation and compression hose which she does not want to do   4. Primary hypertension Controlled  5. Reactive depression She is still quite angry about the move to skilled care and is still dealing with feelings of anger and depression. I offered emotional support but this caused further anger. I also offered medications but she declined stating "I will not drug myself!"   Family/ staff Communication:  discussed with the resident and the nurse  Labs/tests ordered:  TSH

## 2020-07-02 LAB — TSH: TSH: 2.59 (ref 0.41–5.90)

## 2020-08-30 ENCOUNTER — Non-Acute Institutional Stay (SKILLED_NURSING_FACILITY): Payer: Medicare Other | Admitting: Internal Medicine

## 2020-08-30 ENCOUNTER — Encounter: Payer: Self-pay | Admitting: Internal Medicine

## 2020-08-30 DIAGNOSIS — I872 Venous insufficiency (chronic) (peripheral): Secondary | ICD-10-CM | POA: Diagnosis not present

## 2020-08-30 DIAGNOSIS — G35 Multiple sclerosis: Secondary | ICD-10-CM | POA: Diagnosis not present

## 2020-08-30 DIAGNOSIS — I10 Essential (primary) hypertension: Secondary | ICD-10-CM | POA: Diagnosis not present

## 2020-08-30 DIAGNOSIS — E890 Postprocedural hypothyroidism: Secondary | ICD-10-CM

## 2020-08-30 DIAGNOSIS — F329 Major depressive disorder, single episode, unspecified: Secondary | ICD-10-CM

## 2020-08-30 NOTE — Progress Notes (Signed)
Location:  [place] Room Number: 137 Place of Service:  SNF [phone])  Provider: Veleta Miners MD  Code Status: DNR Goals of Care:  Advanced Directives 08/30/2020  Does Patient Have a Medical Advance Directive? Yes  Type of Advance Directive Out of facility DNR (pink MOST or yellow form);Healthcare Power of Attorney  Does patient want to make changes to medical advance directive? No - Patient declined  Copy of [place] in Chart? Yes - validated most recent copy scanned in chart (See row information)  Pre-existing out of facility DNR order (yellow form or pink MOST form) Yellow form placed in chart (order not valid for inpatient use)     Chief Complaint  Patient presents with  . Medical Management of Chronic Issues  . Health Maintenance    Hep C screen    HPI: Patient is a 75 y.o. female seen today for medical management of chronic diseases.    Patient has h/o  Progressive MS Has Failed previous treatment Is off all meds H/o Breast Cancer s/p Bilateral Mastectomy Prediabetic Venous Deficiency, Neurogenic Bladder Hypothyroidism  Patient is doing well. Has lost weight as she odes not like the food. Per Staff stays mostly in her room. Does not interact with other residents Told me she is not fine but does not have any complains Except wanted her toe nails trimmed Is now dependent for her ADLs is incontinent of bladder and bowel. Needs Hoyer lift for transfers. Is married husband lives in the the house.  Has 2 daughters who live out of state    Past Medical History:  Diagnosis Date  . Acute bronchitis    Per [facility] ([facility])   . Age-related osteoporosis without current pathological fracture    Per [facility] ([facility])   . Anemia, unspecified    Per [facility] ([facility])   . Atherosclerotic heart disease of native  coronary artery without angina pectoris    Per [facility] ([facility])   . Breast cancer ([place]) 10/08   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  . Cervical atypia   . Cervical atypia    [facility] received [facility]   . Constipation    Per [facility] ([facility])   . Edema    Per [facility] ([facility])   . Fracture of right lower leg 02/2012   NO SURGERY  . Goiter    On synthroid   . History of toxic multinodular goiter   . Hypertension   . Hypertensive nephropathy    Per [facility] ([facility])   . Hypothyroidism, unspecified    Per Matrix Environmental education officer electronic medical records system)   . Multinodular goiter    Per records received from [facility]  . Multiple sclerosis ([place])    PRIMARY PROGRESSIVE- NEUROLOGIST IS DR. Bennett; -INDEPENDENT LIVING USING POWER CHAIR - DRIVES - WORKS- NOT ABLE TO AMBULATE- CAN PULL UP AND TRANSFER.; WEARS DIAPER - UNABLE TO GET TO BATHROOM.  Marland Kitchen Obesity   . Other specified fracture of right pubis, initial encounter for closed fracture ([place])    Per [facility] ([facility])   . Prediabetes    Per [facility] ([facility])   . Renal disease    stage 3  . Stage 3 chronic kidney disease ([place])  Per records received from [facility]  . Thyroid nodule   . Tibia/fibula fracture, right, closed, initial encounter   . 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 Vitamin D deficiency    Per Matrix Environmental education officer electronic medical records system)     Past Surgical History:  Procedure Laterality Date  . bone spur     removed from rt large toe  . BREAST LUMPECTOMY Right 12/06/2006   Per records received form [facility]   .  CATARACT EXTRACTION, BILATERAL    . CERVICAL CONIZATION W/BX  1973  . KNEE ARTHROSCOPY W/ MENISCAL REPAIR  2002  . MASTECTOMY, RADICAL  10/08   BILATERAL  ( RIGHT BREAST CANCER )  . THYROIDECTOMY N/A 04/23/2014   Procedure: TOTAL THYROIDECTOMY;  Surgeon: Armandina Gemma, MD;  Location: [facility];  Service: General;  Laterality: N/A;  . [place]   Per records received form [facility]     Allergies  Allergen Reactions  . Losartan Potassium     Decreased GFR, per Dr. Felipa Eth    Outpatient Encounter Medications as of 08/30/2020  Medication Sig  . ascorbic acid (VITAMIN C) 500 MG tablet Take 500 mg by mouth daily.  . Calcium Carb-Cholecalciferol (CALCIUM-VITAMIN D) 500-200 MG-UNIT tablet Take 1 tablet by mouth daily.  . Cyanocobalamin 1000 MCG TBCR Take 1 tablet by mouth daily.   . furosemide (LASIX) 40 MG tablet Take 40 mg by mouth daily.  Marland Kitchen levothyroxine (SYNTHROID) 150 MCG tablet Take 150 mcg by mouth daily before breakfast.   No facility-administered encounter medications on file as of 08/30/2020.    Review of Systems:  Review of Systems  Constitutional: Positive for activity change and appetite change.  HENT: Negative.   Respiratory: Negative.   Cardiovascular: Positive for leg swelling.  Gastrointestinal: Negative.   Genitourinary: Negative.   Musculoskeletal: Positive for gait problem.  Skin: Negative.   Neurological: Positive for weakness.  Psychiatric/Behavioral: Positive for dysphoric mood.    Health Maintenance  Topic Date Due  . Hepatitis C Screening  Never done  . COVID-19 Vaccine (4 - Booster for Moderna series) 09/21/2020  . INFLUENZA VACCINE  12/06/2020  . TETANUS/TDAP  06/08/2022  . DEXA SCAN  Completed  . PNA vac Low Risk Adult  Completed  . HPV VACCINES  Aged Out    Physical Exam: Vitals:   08/30/20 1643  BP: (!) 145/88  Pulse: 86  Resp: 16  Temp: (!) 97.2 F (36.2 C)  SpO2: 98%  Weight: 237 lb (107.5 kg)  Height: 5\' 4"  (1.626 m)    Body mass index is 40.68 kg/m. Physical Exam Vitals reviewed.  Constitutional:      Appearance: Normal appearance.  HENT:     Head: Normocephalic.     Nose: Nose normal.     Mouth/Throat:     Mouth: Mucous membranes are moist.     Pharynx: Oropharynx is clear.  Eyes:     Pupils: Pupils are equal, round, and reactive to light.  Cardiovascular:     Rate and Rhythm: Normal rate and regular rhythm.     Pulses: Normal pulses.     Heart sounds: No murmur heard.   Pulmonary:     Effort: Pulmonary effort is normal.     Breath sounds: Normal breath sounds.  Abdominal:     General: Abdomen is flat. Bowel sounds are normal.     Palpations: Abdomen  is soft.  Musculoskeletal:        General: Swelling present.     Cervical back: Neck supple.  Skin:    General: Skin is warm.  Neurological:     Mental Status: She is alert and oriented to person, place, and time.  Psychiatric:        Mood and Affect: Mood normal.        Thought Content: Thought content normal.     Labs reviewed: Basic Metabolic Panel: Recent Labs    02/23/20 0000 07/02/20 0000  NA 139  --   K 4.0  --   CL 100  --   CO2 28*  --   BUN 19  --   CREATININE 1.2*  --   CALCIUM 9.7  --   TSH 0.27* 2.59   Liver Function Tests: Recent Labs    02/23/20 0000  AST 15  ALT 12  ALKPHOS 58  ALBUMIN 3.9   No results for input(s): LIPASE, AMYLASE in the last 8760 hours. No results for input(s): AMMONIA in the last 8760 hours. CBC: Recent Labs    02/23/20 0000  WBC 7.4  HGB 14.6  HCT 44  PLT 428*   Lipid Panel: No results for input(s): CHOL, HDL, LDLCALC, TRIG, CHOLHDL, LDLDIRECT in the last 8760 hours. Lab Results  Component Value Date   HGBA1C 6.0 02/23/2020    Procedures since last visit: No results found.  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

## 2020-08-31 LAB — BASIC METABOLIC PANEL
BUN: 18 (ref 4–21)
CO2: 24 — AB (ref 13–22)
Chloride: 103 (ref 99–108)
Creatinine: 1.1 (ref 0.5–1.1)
Glucose: 112
Potassium: 3.7 (ref 3.4–5.3)
Sodium: 141 (ref 137–147)

## 2020-08-31 LAB — CBC: RBC: 5.42 — AB (ref 3.87–5.11)

## 2020-08-31 LAB — HEPATIC FUNCTION PANEL
ALT: 17 (ref 7–35)
AST: 20 (ref 13–35)
Alkaline Phosphatase: 87 (ref 25–125)
Bilirubin, Total: 0.4

## 2020-08-31 LAB — COMPREHENSIVE METABOLIC PANEL
Albumin: 4.1 (ref 3.5–5.0)
Calcium: 9.9 (ref 8.7–10.7)
Globulin: 2.8

## 2020-08-31 LAB — CBC AND DIFFERENTIAL
HCT: 45 (ref 36–46)
Hemoglobin: 15.4 (ref 12.0–16.0)
Platelets: 331 (ref 150–399)
WBC: 6.4

## 2020-08-31 LAB — TSH: TSH: 2.81 (ref 0.41–5.90)

## 2020-11-04 ENCOUNTER — Encounter: Payer: Self-pay | Admitting: Adult Health

## 2020-11-04 ENCOUNTER — Non-Acute Institutional Stay (SKILLED_NURSING_FACILITY): Payer: Medicare Other | Admitting: Adult Health

## 2020-11-04 DIAGNOSIS — Z6841 Body Mass Index (BMI) 40.0 and over, adult: Secondary | ICD-10-CM

## 2020-11-04 DIAGNOSIS — R7303 Prediabetes: Secondary | ICD-10-CM

## 2020-11-04 DIAGNOSIS — I872 Venous insufficiency (chronic) (peripheral): Secondary | ICD-10-CM

## 2020-11-04 DIAGNOSIS — N319 Neuromuscular dysfunction of bladder, unspecified: Secondary | ICD-10-CM

## 2020-11-04 DIAGNOSIS — Z853 Personal history of malignant neoplasm of breast: Secondary | ICD-10-CM

## 2020-11-04 DIAGNOSIS — E890 Postprocedural hypothyroidism: Secondary | ICD-10-CM

## 2020-11-04 DIAGNOSIS — G35 Multiple sclerosis: Secondary | ICD-10-CM

## 2020-11-04 NOTE — Progress Notes (Signed)
Location:   [facility] Room Number: 268-T Place of Service:  SNF [phone]) Provider:  Royal Hawthorn, NP    Patient Care Team: Virgie Dad, MD as PCP - General (Internal Medicine) Royal Hawthorn, NP as Nurse Practitioner (Nurse Practitioner)  Extended Emergency Contact Information Primary Emergency Contact: [name] States of [place] Phone: [phone] Relation: Daughter  Code Status:  DNR Goals of care: Advanced Directive information Advanced Directives 11/04/2020  Does Patient Have a Medical Advance Directive? Yes  Type of Paramedic of [place];Living will;Out of facility DNR (pink MOST or yellow form)  Does patient want to make changes to medical advance directive? No - Patient declined  Copy of [place] [facility] [place] in Chart? Yes - validated most recent copy scanned in chart (See row information)  Pre-existing out of facility DNR order (yellow form or pink MOST form) -     Chief Complaint  Patient presents with   Medical Management of Chronic Issues    Routine Visit.   Health Maintenance    Discuss the need for Hepatitis C Screening.   Immunizations    Discuss the need for Shingirx vaccine, and 2nd Covid Booster.    HPI:  Pt is a 75 y.o. female seen today for medical management of chronic diseases.   PMH significant for MS, anemia, OP with pelvic fx, CAD, obesity, venous insuff, HTN, incontinent, non compliance, prediabetes, breast ca, goiter s/p thyroidectomy.  There are no acute complaints regarding her care. She only lets staff clean her three times a day. She does not leave her room or participate in activities. Has severe progress MS with left sided predominant symptoms with incontinence. Luckily she is right handed.   BP 123/71  Continues with edema in both legs on lasix, no resp symptoms  Has gained 3 lbs in the past few months    Past Medical History:  Diagnosis Date    Acute bronchitis    Per [facility] ([facility] electronic [facility] records system)    Age-related osteoporosis without current pathological fracture    Per [facility] education officer electronic medical records system)    Anemia, unspecified    Per [facility] ([facility] electronic [facility] records system)    Atherosclerotic heart disease of 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    Per [facility] ([facility] records system)    Breast cancer ([place]) 10/08   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   Cervical atypia    Cervical atypia    Per records received from [facility]    Constipation    Per [facility] ([facility] records system)    Edema    Per [facility] ([facility] records system)    Fracture of right lower leg 02/2012   NO SURGERY   Goiter    On synthroid    History of toxic multinodular goiter    Hypertension    Hypertensive nephropathy    Per [facility] education officer electronic medical records system)    Hypothyroidism, unspecified    Per [facility] Environmental education officer electronic medical records system)    Multinodular goiter    Per records received from [facility]   Multiple sclerosis ([place])    PRIMARY PROGRESSIVE- NEUROLOGIST IS DR. Rand; -INDEPENDENT LIVING USING POWER CHAIR - DRIVES - WORKS- NOT ABLE TO AMBULATE- CAN PULL UP AND TRANSFER.; WEARS DIAPER - UNABLE TO GET TO BATHROOM.   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[place])    Per [facility] ([facility]  electronic medical records system)    Vitamin D deficiency    Per [facility] ([facility] records system)    Past Surgical History:  Procedure Laterality Date   bone spur     removed from rt large toe   BREAST LUMPECTOMY Right 12/06/2006   Per records received form [facility]    CATARACT EXTRACTION, BILATERAL     CERVICAL CONIZATION W/BX  1973   KNEE ARTHROSCOPY W/ MENISCAL REPAIR  2002   MASTECTOMY, RADICAL  10/08   BILATERAL  ( RIGHT BREAST CANCER )   THYROIDECTOMY N/A 04/23/2014   Procedure: TOTAL THYROIDECTOMY;  Surgeon: Armandina Gemma, MD;  Location: [facility];  Service: General;  Laterality: N/A;   TUBAL LIGATION  1979   Per records received form [facility]     Allergies  Allergen Reactions   Losartan Potassium     Decreased GFR, per Dr. Felipa Eth    Allergies as of 11/04/2020       Reactions   Losartan Potassium    Decreased GFR, per Dr. Felipa Eth        Medication List        Accurate as of November 04, 2020 10:10 AM. If you have any questions, ask your nurse or doctor.          ascorbic acid 500 MG tablet Commonly known as: VITAMIN C Take 500 mg by mouth daily.   calcium-vitamin D 500-200 MG-UNIT tablet Take 1 tablet by mouth daily.   Cyanocobalamin 1000 MCG Tbcr Take 1 tablet by mouth daily.   furosemide 40 MG tablet Commonly known as: LASIX Take 40 mg by mouth daily.   levothyroxine 150 MCG tablet Commonly known as: SYNTHROID Take 150 mcg by mouth daily before breakfast.        Review of Systems  Constitutional:  Negative for activity change, appetite change, chills, diaphoresis, fatigue, fever and unexpected weight change.  HENT:  Negative for congestion.   Respiratory:  Negative for cough, shortness of breath and wheezing.   Cardiovascular:  Positive for leg swelling. Negative for chest pain and palpitations.  Gastrointestinal:  Negative for abdominal distention, abdominal pain, constipation and diarrhea.   Genitourinary:  Negative for difficulty urinating and dysuria.       Incontinent   Musculoskeletal:  Positive for gait problem. Negative for arthralgias, back pain, joint swelling and myalgias.  Skin:  Negative for rash and wound.  Neurological:  Positive for weakness (left worse). Negative for dizziness, tremors, seizures, syncope, facial asymmetry, speech difficulty, light-headedness, numbness and headaches.  Psychiatric/Behavioral:  Positive for dysphoric mood. Negative for agitation, behavioral problems and confusion.    Immunization History  Administered Date(s) Administered   Influenza Split 02/09/2012   Influenza, High Dose Seasonal PF 02/14/2019, 02/27/2020   Influenza-Unspecified 01/25/2017, 02/02/2018   Moderna Sars-Covid-2 Vaccination 05/20/2019, 06/18/2019, 03/24/2020   Pneumococcal Conjugate-13 07/28/2013   Pneumococcal Polysaccharide-23 03/20/2007, 02/09/2012   Tdap 11/27/2002   Tetanus 06/08/2012   Zoster, Live 07/06/2012  Pertinent  Health Maintenance Due  Topic Date Due   INFLUENZA VACCINE  12/06/2020   DEXA SCAN  Completed   PNA vac Low Risk Adult  Completed   No flowsheet data found. Functional Status Survey:    Vitals:   11/04/20 1008  BP: 123/71  Pulse: 71  Resp: 16  Temp: 97.7 F (36.5 C)  SpO2: 94%  Weight: 238 lb 6.4 oz (108.1 kg)  Height: 5\' 4"  (1.626 m)   Body mass index is 40.92 kg/m. Wt Readings from Last 3 Encounters:  11/04/20 238 lb 6.4 oz (108.1 kg)  08/30/20 237 lb (107.5 kg)  07/01/20 235 lb 6.4 oz (106.8 kg)    Physical Exam Vitals and nursing note reviewed.  Constitutional:      General: She is not in acute distress.    Appearance: She is not diaphoretic.  HENT:     Head: Normocephalic and atraumatic.  Neck:     Vascular: No JVD.  Cardiovascular:     Rate and Rhythm: Normal rate and regular rhythm.     Heart sounds: No murmur heard. Pulmonary:     Effort: Pulmonary effort is normal. No respiratory distress.      Breath sounds: Normal breath sounds. No wheezing.  Abdominal:     General: Bowel sounds are normal. There is no distension.     Palpations: Abdomen is soft.  Musculoskeletal:     Cervical back: No rigidity or tenderness.     Right lower leg: Edema present.     Left lower leg: Edema present.  Lymphadenopathy:     Cervical: No cervical adenopathy.  Skin:    General: Skin is warm and dry.  Neurological:     Mental Status: She is alert and oriented to person, place, and time.     Comments: Generally weak but worse on the left side   Psychiatric:     Comments: Flat     Labs reviewed: Recent Labs    02/23/20 0000 08/31/20 0000  NA 139 141  K 4.0 3.7  CL 100 103  CO2 28* 24*  BUN 19 18  CREATININE 1.2* 1.1  CALCIUM 9.7 9.9   Recent Labs    02/23/20 0000 08/31/20 0000  AST 15 20  ALT 12 17  ALKPHOS 58 87  ALBUMIN 3.9 4.1   Recent Labs    02/23/20 0000 08/31/20 0000  WBC 7.4 6.4  HGB 14.6 15.4  HCT 44 45  PLT 428* 331   Lab Results  Component Value Date   TSH 2.81 08/31/2020   Lab Results  Component Value Date   HGBA1C 6.0 02/23/2020   No results found for: CHOL, HDL, LDLCALC, LDLDIRECT, TRIG, CHOLHDL  Significant Diagnostic Results in last 30 days:  No results found.  Assessment/Plan  1. Postoperative hypothyroidism Lab Results  Component Value Date   TSH 2.81 08/31/2020   Continue Synthroid 150 mcg qd   2. History of breast cancer S/p radial mastectomy   3. Class 3 severe obesity with serious comorbidity and body mass index (BMI) of 40.0 to 44.9 in adult, unspecified obesity type ([place]) Noted   4. Venous insufficiency Continues on lasix 40 mg qd Does not elevate legs and does not wear hose   5. Primary chronic progressive multiple sclerosis (HCC) Severe No longer on meds Progressively weaker with left side dominant   6. Neurogenic dysfunction of the urinary bladder Incontinent continually but refuses to be changes other than every 8 hrs    7.  Prediabetes Fasting glucose 112 on labs in Feb 2022 Pt is not interested in diet changes   Shingrix series has been complete in the past Pt waiting til later for 4th covid shot Discontinue Hep c screening due to goals of care   Family/ staff Communication: pt  Labs/tests ordered:  NA

## 2021-01-03 ENCOUNTER — Non-Acute Institutional Stay (SKILLED_NURSING_FACILITY): Payer: Medicare Other | Admitting: Internal Medicine

## 2021-01-03 ENCOUNTER — Encounter: Payer: Self-pay | Admitting: Internal Medicine

## 2021-01-03 DIAGNOSIS — I872 Venous insufficiency (chronic) (peripheral): Secondary | ICD-10-CM

## 2021-01-03 DIAGNOSIS — G35 Multiple sclerosis: Secondary | ICD-10-CM | POA: Diagnosis not present

## 2021-01-03 DIAGNOSIS — E890 Postprocedural hypothyroidism: Secondary | ICD-10-CM

## 2021-01-03 DIAGNOSIS — Z853 Personal history of malignant neoplasm of breast: Secondary | ICD-10-CM | POA: Diagnosis not present

## 2021-01-03 DIAGNOSIS — F329 Major depressive disorder, single episode, unspecified: Secondary | ICD-10-CM

## 2021-01-03 NOTE — Progress Notes (Signed)
Location:   [facility] Room Number: 137 Place of Service:  SNF [phone]) Provider:  Veleta Miners MD  Virgie Dad, MD  Patient Care Team: Virgie Dad, MD as PCP - General (Internal Medicine) Royal Hawthorn, NP as Nurse Practitioner (Nurse Practitioner)  Extended Emergency Contact Information Primary Emergency Contact: [name] [place] of [place] Phone: [phone] Relation: Daughter  Code Status:  DNR Goals of care: Advanced Directive information Advanced Directives 01/03/2021  Does Patient Have a Medical Advance Directive? Yes  Type of Paramedic of [place];Living will;Out of facility DNR (pink MOST or yellow form)  Does patient want to make changes to medical advance directive? No - Patient declined  Copy of [place] in Chart? Yes - validated most recent copy scanned in chart (See row information)  Pre-existing out of facility DNR order (yellow form or pink MOST form) -     Chief Complaint  Patient presents with   Medical Management of Chronic Issues   Quality Metric Gaps    #4 Covid, flu shot    HPI:  Pt is a 75 y.o. female seen today for medical management of chronic diseases.    Patient has h/o  Progressive MS Has Failed previous treatment Is off all meds H/o Breast Cancer s/p Bilateral Mastectomy Prediabetic Venous Deficiency, Neurogenic Bladder Hypothyroidism  Patient is adjusting to her new status with staff. Does not interact much with anyone. Weight is stable Has no new complains Stays in her Room Dependent for her ADLs is incontinent of bladder and bowel. Needs Hoyer lift for transfers. Husband lives in the the house.  Has 2 daughters who live out of state Past Medical History:  Diagnosis Date   Acute bronchitis    Per [employer] Environmental education officer electronic medical records system)    Age-related osteoporosis without current pathological fracture    Per  [facility] Environmental education officer electronic medical records system)    Anemia, unspecified    Per [facility] ([facility])    Atherosclerotic heart disease of 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    Per [facility] ([facility] records [facility])    Breast cancer [facility]) 10/08   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   Cervical atypia    Cervical atypia    Per records received from [facility]    Constipation    Per [facility] ([facility] records system)    Edema    Per [facility] ([facility])    Fracture of right lower leg 02/2012   NO SURGERY   Goiter    On synthroid    History of toxic multinodular goiter    Hypertension    Hypertensive nephropathy    Per [facility] Environmental education officer electronic medical records system)    Hypothyroidism, unspecified    Per [facility] Environmental education officer electronic medical records system)    Multinodular goiter    Per records received from [facility]   Multiple sclerosis ([place])    PRIMARY PROGRESSIVE- NEUROLOGIST IS DR. Matthews; -INDEPENDENT LIVING USING POWER CHAIR - DRIVES - WORKS- NOT ABLE TO AMBULATE- CAN PULL UP AND TRANSFER.; WEARS DIAPER - UNABLE TO GET TO BATHROOM.   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[place])    Per [facility] ([facility] records system)    Vitamin D deficiency    Per [facility] ([facility] records system)    Past Surgical History:   Procedure Laterality Date   bone spur     removed from rt large toe   BREAST LUMPECTOMY Right 12/06/2006   Per records received form [facility]    CATARACT EXTRACTION, BILATERAL     CERVICAL CONIZATION W/BX  1973   KNEE ARTHROSCOPY W/ MENISCAL REPAIR  2002   MASTECTOMY, RADICAL  10/08   BILATERAL  ( RIGHT BREAST CANCER )   THYROIDECTOMY N/A 04/23/2014   Procedure: TOTAL THYROIDECTOMY;  Surgeon: Armandina Gemma, MD;  Location: [facility];  Service: General;  Laterality: N/A;   TUBAL LIGATION  1979   Per records received form [facility]     Allergies  Allergen Reactions   Losartan Potassium     Decreased GFR, per Dr. Felipa Eth    Allergies as of 01/03/2021       Reactions   Losartan Potassium    Decreased GFR, per Dr. Felipa Eth        Medication List        Accurate as of January 03, 2021 10:59 AM. If you have any questions, ask your nurse or doctor.          ascorbic acid 500 MG tablet Commonly known as: VITAMIN C Take 500 mg by mouth daily.   calcium-vitamin D 500-200 MG-UNIT tablet Take 1 tablet by mouth daily.   Cyanocobalamin 1000 MCG Tbcr Take 1 tablet by mouth daily.   furosemide 40 MG tablet Commonly known as: LASIX Take 40 mg by mouth daily.   levothyroxine 150 MCG tablet Commonly known as: SYNTHROID Take 150 mcg by mouth daily before breakfast.        Review of Systems  Constitutional:  Positive for activity change.  HENT: Negative.    Respiratory: Negative.    Cardiovascular:  Positive for leg swelling.  Gastrointestinal: Negative.   Genitourinary: Negative.   Musculoskeletal:  Positive for gait problem.  Skin: Negative.   Neurological:  Negative for dizziness.  Psychiatric/Behavioral:  Positive for dysphoric mood.    Immunization History  Administered Date(s) Administered   Influenza Split 02/09/2012   Influenza, High Dose Seasonal PF 02/14/2019, 02/27/2020   Influenza-Unspecified 01/25/2017, 02/02/2018   Moderna  Sars-Covid-2 Vaccination 05/20/2019, 06/18/2019, 03/24/2020   Pneumococcal Conjugate-13 07/28/2013   Pneumococcal Polysaccharide-23 03/20/2007, 02/09/2012   Tdap 11/27/2002   Tetanus 06/08/2012   Zoster Recombinat (Shingrix) 07/27/2014, 03/29/2015   Zoster, Live 07/06/2012   Pertinent  Health Maintenance Due  Topic Date Due   INFLUENZA VACCINE  12/06/2020   DEXA SCAN  Completed   PNA vac Low Risk Adult  Completed   No flowsheet data found. Functional Status Survey:    Vitals:   01/03/21 1040  BP: (!) 118/52  Pulse: 87  Resp: 18  Temp: (!) 97.3 F (36.3 C)  SpO2: 95%  Weight: 238 lb (108 kg)  Height: '5\' 4"'$  (1.626 m)   Body mass index is 40.85 kg/m. Physical Exam Vitals reviewed.  Constitutional:      Appearance: Normal appearance.  HENT:     Head:  Normocephalic.     Nose: Nose normal.     Mouth/Throat:     Mouth: Mucous membranes are moist.     Pharynx: Oropharynx is clear.  Eyes:     Pupils: Pupils are equal, round, and reactive to light.  Cardiovascular:     Rate and Rhythm: Normal rate and regular rhythm.     Pulses: Normal pulses.  Pulmonary:     Effort: Pulmonary effort is normal.     Breath sounds: Normal breath sounds.  Abdominal:     General: Abdomen is flat. Bowel sounds are normal.     Palpations: Abdomen is soft.  Musculoskeletal:        General: Swelling present.     Cervical back: Neck supple.  Skin:    General: Skin is warm.  Neurological:     Mental Status: She is alert.     Comments: Cannot Move her Left UE and Both LE    Labs reviewed: Recent Labs    02/23/20 0000 08/31/20 0000  NA 139 141  K 4.0 3.7  CL 100 103  CO2 28* 24*  BUN 19 18  CREATININE 1.2* 1.1  CALCIUM 9.7 9.9   Recent Labs    02/23/20 0000 08/31/20 0000  AST 15 20  ALT 12 17  ALKPHOS 58 87  ALBUMIN 3.9 4.1   Recent Labs    02/23/20 0000 08/31/20 0000  WBC 7.4 6.4  HGB 14.6 15.4  HCT 44 45  PLT 428* 331   Lab Results  Component Value Date   TSH  2.81 08/31/2020   Lab Results  Component Value Date   HGBA1C 6.0 02/23/2020   No results found for: CHOL, HDL, LDLCALC, LDLDIRECT, TRIG, CHOLHDL  Significant Diagnostic Results in last 30 days:  No results found.  Assessment/Plan  Primary chronic progressive multiple sclerosis ([place]) Wheelchair and [employer] Dependent Failed treatment before Incontinent of Urine and Bladder  Postoperative hypothyroidism TSH normal in 4/22 Venous insufficiency On Diuretics  Reactive depression No Antidepressant right now   Family/ staff Communication:   Labs/tests ordered:

## 2021-02-25 ENCOUNTER — Non-Acute Institutional Stay (SKILLED_NURSING_FACILITY): Payer: Medicare Other | Admitting: Adult Health

## 2021-02-25 ENCOUNTER — Encounter: Payer: Self-pay | Admitting: Adult Health

## 2021-02-25 DIAGNOSIS — E890 Postprocedural hypothyroidism: Secondary | ICD-10-CM

## 2021-02-25 DIAGNOSIS — Z6841 Body Mass Index (BMI) 40.0 and over, adult: Secondary | ICD-10-CM

## 2021-02-25 DIAGNOSIS — I872 Venous insufficiency (chronic) (peripheral): Secondary | ICD-10-CM | POA: Diagnosis not present

## 2021-02-25 DIAGNOSIS — I129 Hypertensive chronic kidney disease with stage 1 through stage 4 chronic kidney disease, or unspecified chronic kidney disease: Secondary | ICD-10-CM

## 2021-02-25 DIAGNOSIS — G35 Multiple sclerosis: Secondary | ICD-10-CM | POA: Diagnosis not present

## 2021-02-25 DIAGNOSIS — I10 Essential (primary) hypertension: Secondary | ICD-10-CM

## 2021-02-25 DIAGNOSIS — R7303 Prediabetes: Secondary | ICD-10-CM

## 2021-02-25 NOTE — Progress Notes (Signed)
Location:   [place] Room Number: 137 Place of Service:  SNF [phone]) Provider:  Royal Hawthorn, NP  Virgie Dad, MD  Patient Care Team: Virgie Dad, MD as PCP - General (Internal Medicine) Royal Hawthorn, NP as Nurse Practitioner (Nurse Practitioner)  Extended Emergency Contact Information Primary Emergency Contact: [name] [place] of [place] Phone: [phone] Relation: Daughter  Code Status:  DNR Goals of care: Advanced Directive information Advanced Directives 02/25/2021  Does Patient Have a Medical Advance Directive? Yes  Type of Paramedic of [place];Living will;Out of facility DNR (pink MOST or yellow form)  Does patient want to make changes to medical advance directive? No - Patient declined  Copy of [place] in Chart? Yes - validated most recent copy scanned in chart (See row information)  Pre-existing out of facility DNR order (yellow form or pink MOST form) -     Chief Complaint  Patient presents with   Medical Management of Chronic Issues   Quality Metric Gaps    #4 covid due according to [facility] and [facility]    HPI:  Pt is a 75 y.o. female seen today for medical management of chronic diseases.   PMH significant for MS, anemia, OP with pelvic fx, CAD, obesity, venous insuff, HTN, incontinent, non compliance, prediabetes, breast ca, goiter s/p thyroidectomy.  Down three lbs. Has edema in both legs which is chronic, no ulcerations.  Wt Readings from Last 3 Encounters:  02/25/21 235 lb 6.4 oz (106.8 kg)  01/03/21 238 lb (108 kg)  11/04/20 238 lb 6.4 oz (108.1 kg)    She spends most of the day in her room watching movies. States "I hate living here, write that down in your note" Has refused meds for depression in the past.   Bp readings  Blood Pressure: 135 / 86 mmHg  Blood Pressure: 123 / 80 mmHg   Blood Pressure: 135 / 92 mmHg  Hoyer lift for  transfers and incontinent due to MS.  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    Constipation    Per [facility] ([facility] electronic [facility] records system)    Edema    Per [facility] ([facility] records system)    Fracture of right lower leg 02/2012   NO SURGERY   Goiter    On synthroid    History of toxic multinodular goiter    Hypertension    Hypertensive nephropathy    Per [facility] education officer electronic medical records system)    Hypothyroidism, unspecified    Per [facility] Environmental education officer electronic medical records system)    Multinodular goiter    Per records received from [place]   Multiple sclerosis ([place])    PRIMARY PROGRESSIVE- NEUROLOGIST IS DR. Higden; -[place]-  CAN PULL UP AND TRANSFER.; WEARS DIAPER - UNABLE TO GET TO BATHROOM.   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 ([place])    Per [facility] ([facility] records system)    Vitamin D deficiency    Per [facility] ([facility] records system)    Past Surgical History:  Procedure Laterality Date   bone spur     removed from rt large toe   BREAST LUMPECTOMY Right 12/06/2006   Per records received form [facility]    CATARACT EXTRACTION, BILATERAL     CERVICAL CONIZATION W/BX  1973   KNEE ARTHROSCOPY W/ MENISCAL REPAIR  2002   MASTECTOMY, RADICAL  10/08   BILATERAL  ( RIGHT BREAST CANCER )   THYROIDECTOMY N/A 04/23/2014   Procedure: TOTAL THYROIDECTOMY;  Surgeon: Armandina Gemma, MD;  Location: [facility];  Service: General;  Laterality: N/A;   TUBAL LIGATION  1979   Per records received form [facility]     Allergies  Allergen Reactions   Losartan Potassium     Decreased GFR, per Dr. Felipa Eth    Allergies as of 02/25/2021       Reactions   Losartan Potassium    Decreased GFR, per Dr. Felipa Eth        Medication List        Accurate as of February 25, 2021 10:31 AM. If you have any questions, ask your nurse or doctor.          ascorbic acid 500 MG tablet Commonly known as: VITAMIN C Take 500 mg by mouth daily.   calcium-vitamin D 500-200 MG-UNIT tablet Take 1 tablet by mouth daily.   Cyanocobalamin 1000 MCG Tbcr Take 1 tablet by mouth daily.   furosemide 40 MG tablet Commonly known as: LASIX Take 40 mg by mouth daily.   levothyroxine 150 MCG tablet Commonly known as: SYNTHROID Take 150 mcg by mouth daily before breakfast.        Review of Systems  Constitutional:  Negative for activity change, appetite change, chills, diaphoresis, fatigue, fever and unexpected weight change.  HENT:  Negative for congestion.   Eyes:  Negative for visual disturbance.  Respiratory:  Negative for cough, shortness of breath and wheezing.   Cardiovascular:  Positive for leg swelling. Negative for chest pain  and palpitations.  Gastrointestinal:  Negative for abdominal distention, abdominal pain, constipation and diarrhea.  Genitourinary:  Negative for difficulty urinating and dysuria.  Musculoskeletal:  Positive for gait problem. Negative for arthralgias, back pain, joint swelling and myalgias.  Skin:  Negative for wound.  Neurological:  Positive for weakness. Negative for dizziness, tremors, seizures, syncope, facial asymmetry, speech difficulty, light-headedness, numbness and headaches.  Psychiatric/Behavioral:  Positive for dysphoric mood. Negative for agitation, behavioral problems, confusion and sleep disturbance.    Immunization History  Administered Date(s) Administered   Fluad Quad(high Dose 65+) 02/09/2021   Influenza Split 02/09/2012   Influenza, High Dose Seasonal PF 02/14/2019, 02/27/2020   Influenza-Unspecified 01/25/2017, 02/02/2018   Moderna Sars-Covid-2 Vaccination  05/20/2019, 06/18/2019, 03/24/2020   Pneumococcal Conjugate-13 07/28/2013   Pneumococcal Polysaccharide-23 03/20/2007, 02/09/2012   Tdap 11/27/2002   Tetanus 06/08/2012   Zoster Recombinat (Shingrix) 07/27/2014, 03/29/2015   Zoster, Live 07/06/2012   Pertinent  Health Maintenance Due  Topic Date Due   INFLUENZA VACCINE  Completed   DEXA SCAN  Completed   No flowsheet data found. Functional Status Survey:    Vitals:   02/25/21 1027  BP: 135/86  Pulse: 80  Resp: 15  Temp: (!) 97.3 F (36.3 C)  SpO2: 95%  Weight: 235 lb 6.4 oz (106.8 kg)  Height: 5\' 4"  (1.626 m)   Body mass index is 40.41 kg/m. Physical Exam Vitals and nursing note reviewed.  Constitutional:      General: She is not in acute distress.    Appearance: She is not diaphoretic.  HENT:     Head: Normocephalic and atraumatic.  Neck:     Vascular: No JVD.  Cardiovascular:     Rate and Rhythm: Normal rate and regular rhythm.     Heart sounds: No murmur heard. Pulmonary:     Effort: Pulmonary effort is normal. No respiratory  distress.     Breath sounds: Normal breath sounds. No wheezing.  Abdominal:     General: Bowel sounds are normal. There is no distension.     Palpations: Abdomen is soft.     Tenderness: There is no abdominal tenderness.  Musculoskeletal:     Right lower leg: Edema (+1) present.     Left lower leg: Edema (+1) present.  Skin:    General: Skin is warm and dry.  Neurological:     Mental Status: She is alert and oriented to person, place, and time.  Psychiatric:     Comments: angry    Labs reviewed: Recent Labs    08/31/20 0000  NA 141  K 3.7  CL 103  CO2 24*  BUN 18  CREATININE 1.1  CALCIUM 9.9   Recent Labs    08/31/20 0000  AST 20  ALT 17  ALKPHOS 87  ALBUMIN 4.1   Recent Labs    08/31/20 0000  WBC 6.4  HGB 15.4  HCT 45  PLT 331   Lab Results  Component Value Date   TSH 2.81 08/31/2020   Lab Results  Component Value Date   HGBA1C 6.0 02/23/2020   No results found for: CHOL, HDL, LDLCALC, LDLDIRECT, TRIG, CHOLHDL  Significant Diagnostic Results in last 30 days:  No results found.  Assessment/Plan  1. Primary hypertension Controlled with lasix 40 mg qd   2. Venous insufficiency On lasix,  Does not elevate her legs or wear ted hose  3. Postoperative hypothyroidism . Lab Results  Component Value Date   TSH 2.81 08/31/2020   Check annually  Continue Synthroid 40 mg q d  4. 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   5. Hypertensive nephropathy, stage 1-4 or unspecified chronic kidney disease Continue to periodically monitor BMP and avoid nephrotoxic agents  6. Prediabetes Check A1C   7. Class 3 severe obesity due to excess calories with serious comorbidity and body mass index (BMI) of 40.0 to 44.9 in adult (HCC) Slight weight decrease Not interested in diet changes.    Had 4th covid booster  Family/ staff Communication: resident   Labs/tests ordered:  BMP CBC Lipid A1C

## 2021-02-28 LAB — HEMOGLOBIN A1C: Hemoglobin A1C: 5.6

## 2021-02-28 LAB — BASIC METABOLIC PANEL
BUN: 15 (ref 4–21)
CO2: 26 — AB (ref 13–22)
Chloride: 106 (ref 99–108)
Creatinine: 1.1 (ref 0.5–1.1)
Glucose: 96
Potassium: 4.1 (ref 3.4–5.3)
Sodium: 143 (ref 137–147)

## 2021-02-28 LAB — COMPREHENSIVE METABOLIC PANEL: Calcium: 9.1 (ref 8.7–10.7)

## 2021-02-28 LAB — LIPID PANEL
Cholesterol: 188 (ref 0–200)
HDL: 44 (ref 35–70)
LDL Cholesterol: 125
LDl/HDL Ratio: 4.3
Triglycerides: 97 (ref 40–160)

## 2021-06-09 ENCOUNTER — Encounter: Payer: Self-pay | Admitting: Adult Health

## 2021-06-09 ENCOUNTER — Non-Acute Institutional Stay (SKILLED_NURSING_FACILITY): Payer: Medicare Other | Admitting: Adult Health

## 2021-06-09 DIAGNOSIS — E890 Postprocedural hypothyroidism: Secondary | ICD-10-CM | POA: Diagnosis not present

## 2021-06-09 DIAGNOSIS — G35 Multiple sclerosis: Secondary | ICD-10-CM | POA: Diagnosis not present

## 2021-06-09 DIAGNOSIS — N319 Neuromuscular dysfunction of bladder, unspecified: Secondary | ICD-10-CM | POA: Diagnosis not present

## 2021-06-09 DIAGNOSIS — Z6841 Body Mass Index (BMI) 40.0 and over, adult: Secondary | ICD-10-CM

## 2021-06-09 DIAGNOSIS — R7303 Prediabetes: Secondary | ICD-10-CM

## 2021-06-09 DIAGNOSIS — I10 Essential (primary) hypertension: Secondary | ICD-10-CM

## 2021-06-09 DIAGNOSIS — F329 Major depressive disorder, single episode, unspecified: Secondary | ICD-10-CM | POA: Insufficient documentation

## 2021-06-09 NOTE — Assessment & Plan Note (Signed)
Does not want to diet

## 2021-06-09 NOTE — Assessment & Plan Note (Signed)
Very angry about her condition. Has difficulty adjusting to her illness and skilled are status. Has declined depression meds numerous times. Does not voice feelings of suicide. Will not elaborate and becomes angry when questioned.

## 2021-06-09 NOTE — Assessment & Plan Note (Signed)
Lab Results  Component Value Date   HGBA1C 5.6 02/28/2021

## 2021-06-09 NOTE — Assessment & Plan Note (Signed)
No new issues Dependent on staff for ADLs. Spends most of the day in her WC.

## 2021-06-09 NOTE — Assessment & Plan Note (Signed)
Incontinent of urine Skilled care for incontinence management No new issues.

## 2021-06-09 NOTE — Assessment & Plan Note (Signed)
Lab Results  Component Value Date   TSH 2.81 08/31/2020   ON synthroid Needs TSH with next draw in two months

## 2021-06-09 NOTE — Progress Notes (Signed)
Location:   [place] Room Number: 137 Place of Service:  SNF [phone]) Provider:  Royal Hawthorn, NP   Virgie Dad, MD  Patient Care Team: Virgie Dad, MD as PCP - General (Internal Medicine) Royal Hawthorn, NP as Nurse Practitioner (Nurse Practitioner)  Extended Emergency Contact Information Primary Emergency Contact: [name] States of [place] Phone: [phone] Relation: Daughter  Code Status:  DNR Goals of care: Advanced Directive information Advanced Directives 06/09/2021  Does Patient Have a Medical Advance Directive? Yes  Type of Advance Directive [place]  Does patient want to make changes to medical advance directive? No - Patient declined  Copy of [place] in Chart? Yes - validated most recent copy scanned in chart (See row information)  Pre-existing out of facility DNR order (yellow form or pink MOST form) -     Chief Complaint  Patient presents with   Medical Management of Chronic Issues   Quality Metric Gaps    Covid-19 #5 vaccine    HPI:  Pt is a 76 y.o. female seen today for medical management of chronic diseases.   PMH significant for MS, anemia, OP with pelvic fx, CAD, obesity, venous insuff, HTN, incontinent, non compliance, prediabetes, breast ca, goiter s/p thyroidectomy.  No significant change in her status. Due to MS she is WC bound and needs assistance with all ADLs. Denies pain Remains overweight. Not interested in dieting.  Wt Readings from Last 3 Encounters:  06/09/21 238 lb (108 kg)  02/25/21 235 lb 6.4 oz (106.8 kg)  01/03/21 238 lb (108 kg)   She stays in her room all the time and does not socialize. She says her mind is fine its her body that's failing. She is quite depressed and angry about her condition but doesn't want to take meds for it.  She is incontinent. She only allows them to change her at certain times during the day and none at  night. No skin issues reported.  Hx of breast ca s/p bilateral mastectomies.  S/p thyroidectomy due to prior multinodular goiter.  Lab Results  Component Value Date   TSH 2.81 08/31/2020   Osteopenia on prior dexa in 2014. NO longer has them due to [place] bound status. 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   Cervical atypia    Cervical atypia    Per records received from [facility]    Constipation    Per [facility] ([facility] records [facility])    Edema    Per [facility] ([facility])    Fracture of right lower leg 02/2012   NO SURGERY   Goiter    On synthroid    History of toxic multinodular goiter    Hypertension    Hypertensive nephropathy    Per [facility] ([facility] records [facility])    Hypothyroidism, unspecified    Per [facility] Environmental education officer electronic medical records system)  Multinodular goiter    Per records received from [place]   Multiple sclerosis ([place])    PRIMARY PROGRESSIVE- NEUROLOGIST IS DR. [place]; -INDEPENDENT LIVING USING POWER CHAIR - DRIVES - WORKS- NOT ABLE TO AMBULATE- CAN PULL UP AND TRANSFER.; WEARS DIAPER - UNABLE TO GET TO BATHROOM.   Obesity    Other specified fracture of right pubis, initial encounter for closed fracture ([place])    Per [facility] Environmental education officer electronic medical records system)    Prediabetes    Per  [facility] Environmental education officer electronic medical records system)    Renal disease    stage 3   Stage 3 chronic kidney disease (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 ([place])    Per [facility] ([facility] records system)    Vitamin D deficiency    Per [facility] ([facility] records system)    Past Surgical History:  Procedure Laterality Date   bone spur     removed from rt large toe   BREAST LUMPECTOMY Right 12/06/2006   Per records received form [facility]    CATARACT EXTRACTION, BILATERAL     CERVICAL CONIZATION W/BX  1973   KNEE ARTHROSCOPY W/ MENISCAL REPAIR  2002   MASTECTOMY, RADICAL  10/08   BILATERAL  ( RIGHT BREAST CANCER )   THYROIDECTOMY N/A 04/23/2014   Procedure: TOTAL THYROIDECTOMY;  Surgeon: Armandina Gemma, MD;  Location: [facility];  Service: General;  Laterality: N/A;   TUBAL LIGATION  1979   Per records received form [facility]     Allergies  Allergen Reactions   Losartan Potassium     Decreased GFR, per Dr. Felipa Eth    Allergies as of 06/09/2021       Reactions   Losartan Potassium    Decreased GFR, per Dr. Felipa Eth        Medication List        Accurate as of June 09, 2021  2:07 PM. If you have any questions, ask your nurse or doctor.          ascorbic acid 500 MG tablet Commonly known as: VITAMIN C Take 500 mg by mouth daily.   calcium-vitamin D 500-200 MG-UNIT tablet Take 1 tablet by mouth daily.   Cyanocobalamin 1000 MCG Tbcr Take 1 tablet by mouth daily.   furosemide 40 MG tablet Commonly known as: LASIX Take 40 mg by mouth daily.   levothyroxine 150 MCG tablet Commonly known as: SYNTHROID Take 150 mcg by mouth daily before breakfast.        Review of Systems  Constitutional:  Negative for activity change, appetite change, chills, diaphoresis, fatigue,  fever and unexpected weight change.  HENT:  Negative for congestion.   Respiratory:  Negative for cough, shortness of breath and wheezing.   Cardiovascular:  Negative for chest pain, palpitations and leg swelling.  Gastrointestinal:  Negative for abdominal distention, abdominal pain, constipation and diarrhea.  Genitourinary:  Negative for difficulty urinating and dysuria.  Musculoskeletal:  Positive for gait problem. Negative for arthralgias, back pain, joint swelling and myalgias.  Neurological:  Positive for weakness. Negative for dizziness, tremors, seizures, syncope, facial asymmetry, speech difficulty, light-headedness, numbness and headaches.  Psychiatric/Behavioral:  Positive for dysphoric mood. Negative for agitation, behavioral problems and confusion.    Immunization History  Administered Date(s) Administered  Fluad Quad(high Dose 65+) 02/09/2021   Influenza Split 02/09/2012   Influenza, High Dose Seasonal PF 02/14/2019, 02/27/2020   Influenza-Unspecified 01/25/2017, 02/02/2018   Moderna Sars-Covid-2 Vaccination 05/20/2019, 06/18/2019, 03/24/2020, 02/16/2021   Pneumococcal Conjugate-13 07/28/2013   Pneumococcal Polysaccharide-23 03/20/2007, 02/09/2012   Tdap 11/27/2002   Tetanus 06/08/2012   Zoster Recombinat (Shingrix) 07/27/2014, 03/29/2015   Zoster, Live 07/06/2012   Pertinent  Health Maintenance Due  Topic Date Due   INFLUENZA VACCINE  Completed   DEXA SCAN  Completed   No flowsheet data found. Functional Status Survey:    Vitals:   06/09/21 1405  BP: 132/73  Pulse: 79  Resp: (!) 21  Temp: 97.7 F (36.5 C)  SpO2: 98%  Weight: 238 lb (108 kg)  Height: 5\' 4"  (1.626 m)   Body mass index is 40.85 kg/m. Physical Exam Vitals and nursing note reviewed.  Constitutional:      General: She is not in acute distress.    Appearance: She is not diaphoretic.  HENT:     Head: Normocephalic and atraumatic.     Mouth/Throat:     Mouth: Mucous membranes are moist.      Pharynx: Oropharynx is clear.  Neck:     Vascular: No JVD.  Cardiovascular:     Rate and Rhythm: Normal rate and regular rhythm.     Heart sounds: No murmur heard. Pulmonary:     Effort: Pulmonary effort is normal. No respiratory distress.     Breath sounds: Normal breath sounds. No wheezing.  Abdominal:     General: Abdomen is flat. Bowel sounds are normal. There is no distension.  Skin:    General: Skin is warm and dry.  Neurological:     Mental Status: She is alert and oriented to person, place, and time.  Psychiatric:     Comments: angry    Labs reviewed: Recent Labs    08/31/20 0000 02/28/21 0000  NA 141 143  K 3.7 4.1  CL 103 106  CO2 24* 26*  BUN 18 15  CREATININE 1.1 1.1  CALCIUM 9.9 9.1   Recent Labs    08/31/20 0000  AST 20  ALT 17  ALKPHOS 87  ALBUMIN 4.1   Recent Labs    08/31/20 0000  WBC 6.4  HGB 15.4  HCT 45  PLT 331   Lab Results  Component Value Date   TSH 2.81 08/31/2020   Lab Results  Component Value Date   HGBA1C 5.6 02/28/2021   Lab Results  Component Value Date   CHOL 188 02/28/2021   HDL 44 02/28/2021   LDLCALC 125 02/28/2021   TRIG 97 02/28/2021    Significant Diagnostic Results in last 30 days:  No results found.  Assessment/Plan  Primary chronic progressive multiple sclerosis ([place]) No new issues Dependent on staff for ADLs. Spends most of the day in her WC.    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   Class 3 severe obesity with serious comorbidity and body mass index (BMI) of 40.0 to 44.9 in adult [facility]) Does not want to diet  Hypertension Controlled On lasix 40 mg qd   Major depression, chronic Very angry  about her condition. Has  difficulty adjusting to her illness and skilled are status. Has declined depression meds numerous times. Does not voice feelings of suicide. Will not elaborate and becomes angry when questioned.    Family/ staff Communication: nurse  Labs/tests ordered:  TSH BMP in two months

## 2021-06-09 NOTE — Assessment & Plan Note (Signed)
Controlled On lasix 40 mg qd

## 2021-07-14 ENCOUNTER — Non-Acute Institutional Stay (SKILLED_NURSING_FACILITY): Payer: Medicare Other | Admitting: Adult Health

## 2021-07-14 ENCOUNTER — Encounter: Payer: Self-pay | Admitting: Adult Health

## 2021-07-14 DIAGNOSIS — I10 Essential (primary) hypertension: Secondary | ICD-10-CM

## 2021-07-14 DIAGNOSIS — R109 Unspecified abdominal pain: Secondary | ICD-10-CM

## 2021-07-14 DIAGNOSIS — E890 Postprocedural hypothyroidism: Secondary | ICD-10-CM

## 2021-07-14 DIAGNOSIS — M816 Localized osteoporosis [Lequesne]: Secondary | ICD-10-CM | POA: Diagnosis not present

## 2021-07-14 DIAGNOSIS — G35 Multiple sclerosis: Secondary | ICD-10-CM

## 2021-07-14 NOTE — Progress Notes (Signed)
Location:  Occupational psychologist of Service:  SNF (31) Provider:   Cindi Carbon, [place] [phone]   Virgie Dad, MD  Patient Care Team: Virgie Dad, MD as PCP - General (Internal Medicine) Royal Hawthorn, NP as Nurse Practitioner (Nurse Practitioner)  Extended Emergency Contact Information Primary Emergency Contact: [name] [place] Phone: [phone] Relation: Daughter  Code Status:  DNR Goals of care: Advanced Directive information Advanced Directives 06/09/2021  Does Patient Have a Medical Advance Directive? Yes  Type of Advance Directive [place]  Does patient want to make changes to medical advance directive? No - Patient declined  Copy of [place] in Chart? Yes - validated most recent copy scanned in chart (See row information)  Pre-existing out of facility DNR order (yellow form or pink MOST form) -     Chief Complaint  Patient presents with   Acute Visit    Left side pain    HPI:  Pt is a 76 y.o. female seen today for acute visit for left sided pain, and medical management of chronic diseases.    PMH significant for MS, anemia, OP with pelvic fx, CAD, obesity, venous insuff, HTN, incontinent, non compliance, prediabetes, breast ca, goiter s/p thyroidectomy.  She resides in skilled care due to severe progressive MS. She is no longeer on treatment. Spends most of the day in her room watching movies. She requires a lift for transfers and is incontinent. She is able to feed herself and is alert and oriented. Able to communicate her needs.   On 3/7 she had left lower lung/flank pain. She had a temp of 99.5. She said she was placed on her side which led to the discomfort. The nurse thought the patient's PCP was Dr. Felipa Eth and so he called recommended that she go to the ER. The patient declined evaluation and the symptom went away. She is a pt of [facility]  and has been for quite sometime. She is upset that she is "being bothered" about this symptom and wishes that everyone would "leave her alone". She denies any pain on inspiration. Denies any urinary symptoms. Sats are WNL on room air. No cough or sob. Has chronic edema in her legs which is unchanged.     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   Cervical atypia    Cervical atypia    Per records received from [facility]    Constipation    Per [facility] electronic medical records system)    Edema    Per [facility] electronic medical records system)    Fracture of right lower leg 02/2012   NO SURGERY   Goiter    On synthroid    History of toxic multinodular goiter    Hypertension    Hypertensive nephropathy    Per [facility] ([facility] electronic medical records system)    Hypothyroidism, unspecified    Per [facility] education officer electronic medical records  system)    Multinodular goiter    Per records received from [place]   Multiple sclerosis ([place])    PRIMARY PROGRESSIVE- NEUROLOGIST IS DR. Dennis; -INDEPENDENT LIVING USING POWER CHAIR - DRIVES - WORKS- NOT ABLE TO AMBULATE- CAN PULL UP AND TRANSFER.; WEARS DIAPER - UNABLE TO GET TO BATHROOM.   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 ([place])    Per [facility] electronic medical records system)    Vitamin D deficiency    Per [facility] electronic medical records system)    Past Surgical History:  Procedure Laterality Date   bone spur     removed from rt large toe   BREAST LUMPECTOMY Right 12/06/2006   Per records received form [facility]    CATARACT EXTRACTION, BILATERAL     CERVICAL CONIZATION W/BX  1973   KNEE ARTHROSCOPY W/ MENISCAL REPAIR  2002   MASTECTOMY, RADICAL  10/08   BILATERAL  ( RIGHT BREAST CANCER )   THYROIDECTOMY N/A 04/23/2014   Procedure: TOTAL THYROIDECTOMY;  Surgeon: Armandina Gemma, MD;  Location: [facility];  Service: General;  Laterality: N/A;   TUBAL LIGATION  1979   Per records received form [facility]     Allergies  Allergen Reactions   Losartan Potassium     Decreased GFR, per Dr. Felipa Eth    Outpatient Encounter Medications as of 07/14/2021  Medication Sig   acetaminophen (TYLENOL) 325 MG tablet Take 650 mg by mouth every 4 (four) hours as needed.   ascorbic acid (VITAMIN C) 500 MG tablet Take 500 mg by mouth daily.   Calcium Carb-Cholecalciferol (CALCIUM-VITAMIN D) 500-200 MG-UNIT tablet Take 1 tablet by mouth daily.   Cyanocobalamin 1000 MCG TBCR Take 1 tablet by mouth daily.    furosemide (LASIX) 40 MG tablet Take 40 mg by mouth daily.   levothyroxine (SYNTHROID) 150 MCG tablet Take 150 mcg by mouth daily before breakfast.   No facility-administered encounter medications on file as of 07/14/2021.    Review of Systems  Constitutional:  Negative for activity change, appetite change, chills, diaphoresis, fatigue, fever and unexpected weight  change.  HENT:  Negative for congestion.   Respiratory:  Negative for cough, shortness of breath and wheezing.   Cardiovascular:  Positive for leg swelling. Negative for chest pain and palpitations.  Gastrointestinal:  Negative for abdominal distention, abdominal pain, constipation and diarrhea.  Genitourinary:  Negative for difficulty urinating, dysuria, flank pain, frequency, hematuria and pelvic pain.  Musculoskeletal:  Positive for gait problem. Negative for arthralgias, back pain, joint swelling and myalgias.  Neurological:  Positive for weakness. Negative for dizziness, tremors, seizures, syncope, facial asymmetry, speech difficulty, light-headedness, numbness and headaches.  Psychiatric/Behavioral:  Positive for agitation. Negative for behavioral problems and confusion.    Immunization History  Administered Date(s) Administered   Fluad Quad(high Dose 65+) 02/09/2021   Influenza Split 02/09/2012   Influenza, High Dose Seasonal PF 02/14/2019, 02/27/2020   Influenza-Unspecified 01/25/2017, 02/02/2018  Moderna Sars-Covid-2 Vaccination 05/20/2019, 06/18/2019, 03/24/2020, 02/16/2021   Pneumococcal Conjugate-13 07/28/2013   Pneumococcal Polysaccharide-23 03/20/2007, 02/09/2012   Tdap 11/27/2002   Tetanus 06/08/2012   Zoster Recombinat (Shingrix) 07/27/2014, 03/29/2015   Zoster, Live 07/06/2012   Pertinent  Health Maintenance Due  Topic Date Due   INFLUENZA VACCINE  Completed   DEXA SCAN  Completed   No flowsheet data found. Functional Status Survey:    Vitals:   07/14/21 1016  BP: 122/86  Pulse: 89  Resp: (!) 22  Temp: 97.7 F (36.5 C)  SpO2: 93%   There is no height or weight on file to calculate BMI. Physical Exam Vitals and nursing note reviewed.  Constitutional:      General: She is not in acute distress.    Appearance: She is not diaphoretic.  HENT:     Head: Normocephalic and atraumatic.  Neck:     Vascular: No JVD.  Cardiovascular:     Rate and Rhythm:  Normal rate and regular rhythm.     Heart sounds: No murmur heard. Pulmonary:     Effort: Pulmonary effort is normal. No respiratory distress.     Breath sounds: Normal breath sounds. No wheezing.  Abdominal:     General: Abdomen is flat. Bowel sounds are normal. There is no distension.     Palpations: Abdomen is soft.     Tenderness: There is no abdominal tenderness. There is no right CVA tenderness or left CVA tenderness.  Skin:    General: Skin is warm and dry.  Neurological:     Mental Status: She is alert and oriented to person, place, and time.  Psychiatric:        Mood and Affect: Mood normal.    Labs reviewed: Recent Labs    08/31/20 0000 02/28/21 0000  NA 141 143  K 3.7 4.1  CL 103 106  CO2 24* 26*  BUN 18 15  CREATININE 1.1 1.1  CALCIUM 9.9 9.1   Recent Labs    08/31/20 0000  AST 20  ALT 17  ALKPHOS 87  ALBUMIN 4.1   Recent Labs    08/31/20 0000  WBC 6.4  HGB 15.4  HCT 45  PLT 331   Lab Results  Component Value Date   TSH 2.81 08/31/2020   Lab Results  Component Value Date   HGBA1C 5.6 02/28/2021   Lab Results  Component Value Date   CHOL 188 02/28/2021   HDL 44 02/28/2021   LDLCALC 125 02/28/2021   TRIG 97 02/28/2021    Significant Diagnostic Results in last 30 days:  No results found.  Assessment/Plan 1. Flank pain This symptom resolved and she has no further issues. She felt it was a muscle cramp and improved with a position change. She said her low grade temp was due to a warm blanket. She declined any further investigation. IF this symptoms returns staff to call [facility] for further workup.   2. Postoperative hypothyroidism Lab Results  Component Value Date   TSH 2.81 08/31/2020  Continue synthroid   3. Localized osteoporosis without current pathological fracture Not ambulatory Not able to have dexa scans Continue Ca with Vit d  4. 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   5.  Primary hypertension Controlled.  Continue lasix 40 mg qd     Family/ staff Communication: resident.   Labs/tests ordered:  NA

## 2021-08-08 ENCOUNTER — Non-Acute Institutional Stay (SKILLED_NURSING_FACILITY): Payer: Medicare Other | Admitting: Internal Medicine

## 2021-08-08 ENCOUNTER — Encounter: Payer: Self-pay | Admitting: Internal Medicine

## 2021-08-08 DIAGNOSIS — I872 Venous insufficiency (chronic) (peripheral): Secondary | ICD-10-CM | POA: Diagnosis not present

## 2021-08-08 DIAGNOSIS — E890 Postprocedural hypothyroidism: Secondary | ICD-10-CM

## 2021-08-08 DIAGNOSIS — F329 Major depressive disorder, single episode, unspecified: Secondary | ICD-10-CM | POA: Diagnosis not present

## 2021-08-08 DIAGNOSIS — G35 Multiple sclerosis: Secondary | ICD-10-CM | POA: Diagnosis not present

## 2021-08-08 DIAGNOSIS — G35B0 Primary progressive multiple sclerosis, unspecified: Secondary | ICD-10-CM

## 2021-08-08 NOTE — Progress Notes (Signed)
?Location:   [place] ?Nursing Home Room Number: 137 ?Place of Service:  SNF (31) ?Provider:  Veleta Miners MD ? ?Colleen Dad, MD ? ?Patient Care Team: ?Colleen Dad, MD as PCP - General (Internal Medicine) ?Royal Hawthorn, NP as Nurse Practitioner (Nurse Practitioner) ? ?Extended Emergency Contact Information ?Primary Emergency Contact: [name],[name] ? [place] ?Home Phone: [phone] ?Relation: Daughter ? ?Code Status:  DNR ?Goals of care: Advanced Directive information ? ?  08/08/2021  ? 11:37 AM  ?Advanced Directives  ?Does Patient Have a Medical Advance Directive? Yes  ?Type of Advance Directive Healthcare Power of Attorney  ?Does patient want to make changes to medical advance directive? No - Patient declined  ?Copy of [place] in Chart? Yes - validated most recent copy scanned in chart (See row information)  ? ? ? ?Chief Complaint  ?Patient presents with  ? Medical Management of Chronic Issues  ? ? ?HPI:  ?Pt is a 76 y.o. female seen today for medical management of chronic diseases.   ? ?Patient has h/o  ?Progressive MS Has Failed previous treatment ?Is off all meds ?H/o Breast Cancer s/p Bilateral Mastectomy ?Prediabetic ?Venous Deficiency, Neurogenic Bladder ?Hypothyroidism ? ? ? She is stable. No new Nursing issues. ? Her weight is stable ?No New issues. Stays in her room most of the time ?No Falls ?Wt Readings from Last 3 Encounters:  ?08/08/21 238 lb 3.2 oz (108 kg)  ?06/09/21 238 lb (108 kg)  ?02/25/21 235 lb 6.4 oz (106.8 kg)  ?Dependent for her ADLs is incontinent of bladder and bowel. ?Needs [place] lift for transfers. ?Husband lives in the the house.  Has 2 daughters who live out of state ?Past Medical History:  ?Diagnosis Date  ? Acute bronchitis   ? Per [facility] electronic medical records system)   ? Age-related osteoporosis without current pathological fracture   ? Per [facility] electronic medical records  system)   ? Anemia, unspecified   ? Per [facility] electronic medical records system)   ? Atherosclerotic heart disease of 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   ? Per [facility] electronic medical records system)   ? Breast cancer ([place]) 10/08  ? 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 Cervical atypia   ? Cervical atypia   ? Per records received from [place]   ? Constipation   ? Per [facility] electronic medical records system)   ? Edema   ? Per [facility] electronic medical records system)   ? Fracture of right lower leg 02/2012  ? NO SURGERY  ? Goiter   ? On synthroid   ? History of toxic multinodular goiter   ? Hypertension   ? Hypertensive nephropathy   ? Per [facility] electronic medical records system)   ? Hypothyroidism, unspecified   ? Per [facility] electronic medical records system)   ? Multinodular goiter   ? Per records received from [place]  ? Multiple sclerosis ([place])   ? PRIMARY PROGRESSIVE- NEUROLOGIST IS DR. [place]; -INDEPENDENT LIVING USING POWER CHAIR - DRIVES - WORKS- NOT ABLE TO AMBULATE- CAN PULL UP AND TRANSFER.; WEARS DIAPER - UNABLE TO GET TO BATHROOM.  ? Obesity   ? Other specified fracture of right pubis, initial encounter for closed fracture ([place])   ? Per [facility] electronic medical records system)   ? Prediabetes   ? Per [employer] Environmental education officer electronic medical  records system)   ? Renal disease   ? stage 3  ? Stage 3 chronic kidney disease ([place])   ? Per records received from [place]  ? Thyroid nodule   ? Tibia/fibula fracture, right, closed, initial encounter   ? 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 Per [facility] electronic medical records system)   ? Vitamin D deficiency   ? Per [facility] electronic medical records system)   ? ?Past Surgical History:  ?Procedure Laterality Date  ? bone spur    ? removed  from rt large toe  ? BREAST LUMPECTOMY Right 12/06/2006  ? Per records received form [facility]   ? CATARACT EXTRACTION, BILATERAL    ? CERVICAL CONIZATION W/BX  1973  ? KNEE ARTHROSCOPY W/ MENISCAL REPAIR  2002  ? MASTECTOMY, RADICAL  10/08  ? BILATERAL  ( RIGHT BREAST CANCER )  ? THYROIDECTOMY N/A 04/23/2014  ? Procedure: TOTAL THYROIDECTOMY;  Surgeon: Armandina Gemma, MD;  Location: [facility];  Service: General;  Laterality: N/A;  ? [place]  ? Per records received form [facility]   ? ? ?Allergies  ?Allergen Reactions  ? Losartan Potassium   ?  Decreased GFR, per Dr. Felipa Eth  ? ? ?Allergies as of 08/08/2021   ? ?   Reactions  ? Losartan Potassium   ? Decreased GFR, per Dr. Felipa Eth  ? ?  ? ?  ?Medication List  ?  ? ?  ? Accurate as of August 08, 2021 11:37 AM. If you have any questions, ask your nurse or doctor.  ?  ?  ? ?  ? ?acetaminophen 325 MG tablet ?Commonly known as: TYLENOL ?Take 650 mg by mouth every 4 (four) hours as needed. ?  ?ascorbic acid 500 MG tablet ?Commonly known as: VITAMIN C ?Take 500 mg by mouth daily. ?  ?calcium-vitamin D 500-200 MG-UNIT tablet ?Take 1 tablet by mouth daily. ?  ?Cyanocobalamin 1000 MCG Tbcr ?Take 1 tablet by mouth daily. ?  ?furosemide 40 MG tablet ?Commonly known as: LASIX ?Take 40 mg by mouth daily. ?  ?levothyroxine 150 MCG tablet ?Commonly known as: SYNTHROID ?Take 150 mcg by mouth daily before breakfast. ?  ? ?  ? ? ?Review of Systems  ?Constitutional:  Negative for activity change and appetite change.  ?HENT: Negative.    ?Respiratory:  Negative for cough and shortness of breath.   ?Cardiovascular:  Positive for leg swelling.  ?Gastrointestinal:  Negative for constipation.  ?Genitourinary: Negative.   ?Musculoskeletal:  Positive for gait problem. Negative for arthralgias and myalgias.  ?Skin: Negative.   ?Neurological:  Negative for dizziness and weakness.  ?Psychiatric/Behavioral:  Positive for dysphoric mood. Negative for confusion and sleep  disturbance.   ? ?Immunization History  ?Administered Date(s) Administered  ? Fluad Quad(high Dose 65+) 02/09/2021  ? Influenza Split 02/09/2012  ? Influenza, High Dose Seasonal PF 02/14/2019, 02/27/2020  ? Influenza-Unspecified 01/25/2017, 02/02/2018  ? Moderna Sars-Covid-2 Vaccination 05/20/2019, 06/18/2019, 03/24/2020, 02/16/2021  ? Pneumococcal Conjugate-13 07/28/2013  ? Pneumococcal Polysaccharide-23 03/20/2007, 02/09/2012  ? Tdap 11/27/2002  ? Tetanus 06/08/2012  ? Zoster Recombinat (Shingrix) 07/27/2014, 03/29/2015  ? Zoster, Live 07/06/2012  ? ?Pertinent  Health Maintenance Due  ?Topic Date Due  ? INFLUENZA VACCINE  12/06/2021  ? DEXA SCAN  Completed  ? ?   ? View : No data to display.  ?  ?  ?  ? ?Functional Status Survey: ?  ? ?Vitals:  ?  08/08/21 1134  ?BP: 121/81  ?Pulse: 81  ?Resp: 18  ?Temp: (!) 97.3 ?F (36.3 ?C)  ?SpO2: 96%  ?Weight: 238 lb 3.2 oz (108 kg)  ?Height: '5\' 4"'$  (1.626 m)  ? ?Body mass index is 40.89 kg/m?Marland Kitchen ?Physical Exam ?Vitals reviewed.  ?Constitutional:   ?   Appearance: Normal appearance.  ?HENT:  ?   Head: Normocephalic.  ?   Nose: Nose normal.  ?   Mouth/Throat:  ?   Mouth: Mucous membranes are moist.  ?   Pharynx: Oropharynx is clear.  ?Eyes:  ?   Pupils: Pupils are equal, round, and reactive to light.  ?Cardiovascular:  ?   Rate and Rhythm: Normal rate and regular rhythm.  ?   Pulses: Normal pulses.  ?   Heart sounds: Normal heart sounds. No murmur heard. ?Pulmonary:  ?   Effort: Pulmonary effort is normal.  ?   Breath sounds: Normal breath sounds.  ?Abdominal:  ?   General: Abdomen is flat. Bowel sounds are normal.  ?   Palpations: Abdomen is soft.  ?Musculoskeletal:     ?   General: Swelling present.  ?   Cervical back: Neck supple.  ?Skin: ?   General: Skin is warm.  ?Neurological:  ?   Mental Status: She is alert and oriented to person, place, and time.  ?   Comments: Cannot Move her Left UE and Both LE   ?Psychiatric:     ?   Mood and Affect: Mood normal.     ?   Thought  Content: Thought content normal.  ? ? ?Labs reviewed: ?Recent Labs  ?  08/31/20 ?0000 02/28/21 ?0000  ?NA 141 143  ?K 3.7 4.1  ?CL 103 106  ?CO2 24* 26*  ?BUN 18 15  ?CREATININE 1.1 1.1  ?CALCIUM 9.9 9.1  ? ?Recent [place]

## 2021-08-11 LAB — BASIC METABOLIC PANEL
BUN: 21 (ref 4–21)
CO2: 23 — AB (ref 13–22)
Chloride: 99 (ref 99–108)
Creatinine: 1.1 (ref 0.5–1.1)
Glucose: 144
Potassium: 4.1 mEq/L (ref 3.5–5.1)
Sodium: 137 (ref 137–147)

## 2021-08-11 LAB — COMPREHENSIVE METABOLIC PANEL: Calcium: 9.6 (ref 8.7–10.7)

## 2021-08-11 LAB — TSH: TSH: 1.75 (ref 0.41–5.90)

## 2021-11-03 ENCOUNTER — Non-Acute Institutional Stay (SKILLED_NURSING_FACILITY): Payer: Medicare Other | Admitting: Adult Health

## 2021-11-03 ENCOUNTER — Encounter: Payer: Self-pay | Admitting: Adult Health

## 2021-11-03 DIAGNOSIS — E890 Postprocedural hypothyroidism: Secondary | ICD-10-CM

## 2021-11-03 DIAGNOSIS — R7303 Prediabetes: Secondary | ICD-10-CM

## 2021-11-03 DIAGNOSIS — Z6841 Body Mass Index (BMI) 40.0 and over, adult: Secondary | ICD-10-CM

## 2021-11-03 DIAGNOSIS — N319 Neuromuscular dysfunction of bladder, unspecified: Secondary | ICD-10-CM | POA: Diagnosis not present

## 2021-11-03 DIAGNOSIS — G35 Multiple sclerosis: Secondary | ICD-10-CM | POA: Diagnosis not present

## 2021-11-03 DIAGNOSIS — F329 Major depressive disorder, single episode, unspecified: Secondary | ICD-10-CM

## 2021-11-03 NOTE — Progress Notes (Signed)
Location:  [place] Room Number: 195-K Place of Service:  SNF (31) Provider:Telena Peyser, NP   Patient Care Team: Virgie Dad, MD as PCP - General (Internal Medicine) Royal Hawthorn, NP as Nurse Practitioner (Nurse Practitioner)  Extended Emergency Contact Information Primary Emergency Contact: [place] of [place] Phone: [phone] Relation: Daughter  Code Status:  DNR Goals of care: Advanced Directive information    11/03/2021   10:50 AM  Advanced Directives  Does Patient Have a Medical Advance Directive? Yes  Type of Paramedic of [place];Out of facility DNR (pink MOST or yellow form)  Does patient want to make changes to medical advance directive? No - Patient declined  Copy of [place] in Chart? Yes - validated most recent copy scanned in chart (See row information)  Pre-existing out of facility DNR order (yellow form or pink MOST form) Yellow form placed in chart (order not valid for inpatient use)     Chief Complaint  Patient presents with   Medical Management of Chronic Issues    Routine visit.     HPI:  Pt is a 76 y.o. female seen today for medical management of chronic diseases.    PMH significant for MS, anemia, OP with pelvic fx, CAD, obesity, venous insuff, HTN, incontinent, non compliance, prediabetes, breast ca, goiter s/p thyroidectomy.  She resides in skilled care due to severe progressive MS. She is no longer on treatment. She requires a lift for transfers and is incontinent. She is able to feed herself and is alert and oriented. She does not like people to go in her room and is easily angered. Isolates herself. Appears angry and depressed most of the time. Has declined SSRI.    Hx of breast ca with bilat mastectomy  Bps reviewed in matrix  Blood Pressure: 136 / 83 mmHg  Blood Pressure: 118 / 82 mmHg  Blood Pressure: 118 / 82  mmHg  Blood Pressure: 99 / 62 mmHg  Blood Pressure: 132 / 84 mmHg  Blood Pressure: 130 / 70 mmHg  . Wt Readings from Last 3 Encounters:  11/04/21 246 lb 3.2 oz (111.7 kg)  08/08/21 238 lb 3.2 oz (108 kg)  06/09/21 238 lb (108 kg)   Hypothyroidism on synthroid hx of follicular adenoma s/p thyroidectomy Lab Results  Component Value Date   TSH 1.75 08/11/2021   Continues on lasix daily for venous insuff.    A1c 5.6 02/28/21, glucose 144 08/11/21 (not sure if this was fasting) prior glucose 96,112, 90  BMI 42.6  Using tylenol for back pain and spasm at night which seems to be helping.  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   Cervical atypia    Cervical atypia    Per records received from [facility]    Constipation    Per [facility] ([facility] electronic [facility] records system)    Edema    Per [facility] ([facility] records system)    Fracture  of right lower leg 02/2012   NO SURGERY   Goiter    On synthroid    History of toxic multinodular goiter    Hypertension    Hypertensive nephropathy    Per [facility] education officer electronic medical records system)    Hypothyroidism, unspecified    Per [facility] Environmental education officer electronic medical records system)    Multinodular goiter    Per records received from [facility]   Multiple sclerosis ([place])    PRIMARY PROGRESSIVE- NEUROLOGIST IS DR. [place]; -INDEPENDENT  LIVING USING POWER CHAIR - DRIVES - WORKS- NOT ABLE TO AMBULATE- CAN PULL UP AND TRANSFER.; WEARS DIAPER - UNABLE TO GET TO BATHROOM.   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 ([place])    Per [facility] ([facility] electronic [facility] records system)    Vitamin D deficiency    Per [facility] ([facility] electronic [facility] records system)    Past Surgical History:  Procedure Laterality Date   bone spur     removed from rt large toe   BREAST LUMPECTOMY Right 12/06/2006   Per records received form [facility]    CATARACT EXTRACTION, BILATERAL     CERVICAL CONIZATION W/BX  1973   KNEE ARTHROSCOPY W/ MENISCAL REPAIR  2002   MASTECTOMY, RADICAL  10/08   BILATERAL  ( RIGHT BREAST CANCER )   THYROIDECTOMY N/A 04/23/2014   Procedure: TOTAL THYROIDECTOMY;  Surgeon: Armandina Gemma, MD;  Location: [facility];  Service: General;  Laterality: N/A;   TUBAL LIGATION  1979   Per records received form [facility]     Allergies  Allergen Reactions   Losartan Potassium     Decreased GFR, per Dr. Felipa Eth    Outpatient Encounter Medications as of 11/03/2021  Medication Sig   acetaminophen (TYLENOL) 325 MG tablet Take 650 mg by mouth every 4 (four) hours as needed.   ascorbic acid (VITAMIN C) 500 MG tablet Take 500 mg by mouth daily.   Calcium Carb-Cholecalciferol (CALCIUM-VITAMIN D) 500-200 MG-UNIT tablet Take 1 tablet by mouth daily.   Cyanocobalamin 1000 MCG TBCR Take 1 tablet by mouth daily.    furosemide (LASIX) 40 MG tablet Take 40 mg by mouth daily.   levothyroxine  (SYNTHROID) 150 MCG tablet Take 150 mcg by mouth daily before breakfast.   No facility-administered encounter medications on file as of 11/03/2021.    Review of Systems  Constitutional:  Negative for activity change, appetite change, chills, diaphoresis, fatigue, fever and unexpected weight change.  HENT:  Negative for congestion.   Respiratory:  Negative for cough, shortness of breath and wheezing.   Cardiovascular:  Positive for leg swelling. Negative for chest pain and palpitations.  Gastrointestinal:  Negative for abdominal distention, abdominal pain, constipation and diarrhea.  Genitourinary:  Negative for difficulty urinating and dysuria.  Musculoskeletal:  Positive for gait problem. Negative for arthralgias, back pain, joint swelling and myalgias.  Neurological:  Positive for weakness. Negative for dizziness, tremors, seizures, syncope, facial asymmetry, speech difficulty, light-headedness,  numbness and headaches.  Psychiatric/Behavioral:  Negative for agitation, behavioral problems and confusion.     Immunization History  Administered Date(s) Administered   Fluad Quad(high Dose 65+) 02/09/2021   Influenza Split 02/09/2012   Influenza, High Dose Seasonal PF 02/14/2019, 02/27/2020   Influenza-Unspecified 01/25/2017, 02/02/2018   Moderna Covid-19 Vaccine Bivalent Booster 81yr & up 09/04/2021   Moderna Sars-Covid-2 Vaccination 05/20/2019, 06/18/2019, 03/24/2020, 02/16/2021   Pneumococcal Conjugate-13 07/28/2013   Pneumococcal Polysaccharide-23 03/20/2007, 02/09/2012   Tdap 11/27/2002   Tetanus 06/08/2012   Zoster Recombinat (Shingrix) 07/27/2014, 03/29/2015   Zoster, Live 07/06/2012   Pertinent  Health Maintenance Due  Topic Date Due   INFLUENZA VACCINE  12/06/2021   DEXA SCAN  Completed       No data to display         Functional Status Survey:    Vitals:   11/04/21 0920  Weight: 246 lb 3.2 oz (111.7 kg)   Body mass index is 42.26 kg/m. Physical Exam Vitals  and nursing note reviewed.  Constitutional:      General: She is not in acute distress.    Appearance: She is not diaphoretic.  HENT:     Head: Normocephalic and atraumatic.  Neck:     Vascular: No JVD.  Cardiovascular:     Rate and Rhythm: Normal rate and regular rhythm.     Heart sounds: No murmur heard. Pulmonary:     Effort: Pulmonary effort is normal. No respiratory distress.     Breath sounds: Normal breath sounds. No wheezing.  Abdominal:     General: Bowel sounds are normal. There is no distension.     Palpations: Abdomen is soft.     Tenderness: There is no abdominal tenderness.  Musculoskeletal:     Comments: BLE +2  Skin:    General: Skin is warm and dry.  Neurological:     Mental Status: She is alert and oriented to person, place, and time.  Psychiatric:     Comments: Flat, angry, depressed     Labs reviewed: Recent Labs    02/28/21 0000 08/11/21 0000  NA 143 137  K 4.1 4.1  CL 106 99  CO2 26* 23*  BUN 15 21  CREATININE 1.1 1.1  CALCIUM 9.1 9.6   No results for input(s): "AST", "ALT", "ALKPHOS", "BILITOT", "PROT", "ALBUMIN" in the last 8760 hours. No results for input(s): "WBC", "NEUTROABS", "HGB", "HCT", "MCV", "PLT" in the last 8760 hours. Lab Results  Component Value Date   TSH 1.75 08/11/2021   Lab Results  Component Value Date   HGBA1C 5.6 02/28/2021   Lab Results  Component Value Date   CHOL 188 02/28/2021   HDL 44 02/28/2021   LDLCALC 125 02/28/2021   TRIG 97 02/28/2021    Significant Diagnostic Results in last 30 days:  No results found.  Assessment/Plan  1. 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 Postoperative hypothyroidism At goal on synthroid   3. Prediabetes Fasting glucose was high but was possibly not done fasting. WIll repeat in Oct  4. 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   5. Major depression, chronic I tried to  approach her about this and she is not willing to talk about it Also offered to have her chat with another resident with MS the same age as her to help her be more social but she declined.   6. Class 3 severe obesity due to excess calories  with serious comorbidity and body mass index (BMI) of 40.0 to 44.9 in adult [facility]) Not interested in diet modifications   Family/ staff Communication: resident   Labs/tests ordered:  NA

## 2021-11-04 ENCOUNTER — Encounter: Payer: Self-pay | Admitting: Adult Health

## 2022-01-23 ENCOUNTER — Encounter: Payer: Self-pay | Admitting: Internal Medicine

## 2022-01-23 ENCOUNTER — Non-Acute Institutional Stay (SKILLED_NURSING_FACILITY): Payer: Medicare Other | Admitting: Internal Medicine

## 2022-01-23 DIAGNOSIS — Z6841 Body Mass Index (BMI) 40.0 and over, adult: Secondary | ICD-10-CM

## 2022-01-23 DIAGNOSIS — F329 Major depressive disorder, single episode, unspecified: Secondary | ICD-10-CM

## 2022-01-23 DIAGNOSIS — G35 Multiple sclerosis: Secondary | ICD-10-CM

## 2022-01-23 DIAGNOSIS — I872 Venous insufficiency (chronic) (peripheral): Secondary | ICD-10-CM

## 2022-01-23 DIAGNOSIS — E890 Postprocedural hypothyroidism: Secondary | ICD-10-CM

## 2022-01-23 DIAGNOSIS — R030 Elevated blood-pressure reading, without diagnosis of hypertension: Secondary | ICD-10-CM

## 2022-01-23 NOTE — Progress Notes (Signed)
Location:  Occupational psychologist of Service:  SNF (31)  Provider:   Code Status: DNR Goals of Care:     11/03/2021   10:50 AM  Advanced Directives  Does Patient Have a Medical Advance Directive? Yes  Type of Paramedic of [place];Out of facility DNR (pink MOST or yellow form)  Does patient want to make changes to medical advance directive? No - Patient declined  Copy of [place] in Chart? Yes - validated most recent copy scanned in chart (See row information)  Pre-existing out of facility DNR order (yellow form or pink MOST form) Yellow form placed in chart (order not valid for inpatient use)     Chief Complaint  Patient presents with   Chronic Care Management    HPI: Patient is a 76 y.o. female seen today for medical management of chronic diseases.    Lives in SNF  Patient has h/o  Progressive MS Has Failed previous treatment Is off all meds H/o Breast Cancer s/p Bilateral Mastectomy Prediabetic Venous Deficiency, Neurogenic Bladder Hypothyroidism      She is stable. No new Nursing issues. No Behavior issues Hoyer lift dependent No Falls Continues to gain weight  due to immobility Stays in her room all day  Wt Readings from Last 3 Encounters:  01/23/22 244 lb (110.7 kg)  11/04/21 246 lb 3.2 oz (111.7 kg)  08/08/21 238 lb 3.2 oz (108 kg)   Past Medical History:  Diagnosis Date   Acute bronchitis    Per [facility] ([facility])    Age-related osteoporosis without current pathological fracture    Per [facility] Environmental education officer electronic medical records system)    Anemia, unspecified    Per [facility] ([facility])    Atherosclerotic heart disease of 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    Per [facility] ([facility])    Breast cancer ([place]) 10/08   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   Cervical  atypia    Cervical atypia    Per records received from [facility]    Constipation    Per [facility] ([facility])    Edema    Per [facility] ([facility])    Fracture of right lower leg 02/2012   NO SURGERY   Goiter    On synthroid    History of toxic multinodular goiter    Hypertension    Hypertensive nephropathy    Per [facility] Environmental education officer electronic medical records system)    Hypothyroidism, unspecified    Per [facility] Environmental education officer electronic medical records system)    Multinodular goiter    Per records received from [facility]   Multiple sclerosis ([place])    PRIMARY PROGRESSIVE- NEUROLOGIST IS DR. [place]ma [place]; -INDEPENDENT LIVING USING POWER CHAIR - DRIVES - WORKS- NOT ABLE TO AMBULATE- CAN PULL UP AND TRANSFER.; WEARS DIAPER - UNABLE TO GET TO BATHROOM.   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 ([place])    Per [facility] ([facility] records [facility])    Vitamin D deficiency    Per [facility] ([facility] records [facility])     Past Surgical History:  Procedure Laterality Date   bone spur     removed from rt large toe   BREAST LUMPECTOMY Right 12/06/2006   Per records received form [facility]    CATARACT EXTRACTION, BILATERAL     CERVICAL CONIZATION W/BX  1973   KNEE ARTHROSCOPY W/ MENISCAL REPAIR  2002   MASTECTOMY, RADICAL  10/08   BILATERAL  ( RIGHT BREAST CANCER )   THYROIDECTOMY N/A 04/23/2014    Procedure: TOTAL THYROIDECTOMY;  Surgeon: Armandina Gemma, MD;  Location[facility];  Service: General;  Laterality: N/A;   TUBAL LIGATION  1979   Per records received form [facility]     Allergies  Allergen Reactions   Losartan Potassium     Decreased GFR, per Dr. Felipa Eth    Outpatient Encounter Medications as of 01/23/2022  Medication Sig   acetaminophen (TYLENOL) 325 MG tablet Take 650 mg by mouth every 4 (four) hours as needed.   ascorbic acid (VITAMIN C) 500 MG tablet Take 500 mg by mouth daily.   Calcium Carb-Cholecalciferol (CALCIUM-VITAMIN D) 500-200 MG-UNIT tablet Take 1 tablet by mouth daily.   Cyanocobalamin 1000 MCG TBCR Take 1 tablet by mouth daily.    furosemide (LASIX) 40 MG tablet Take 40 mg by mouth daily.   levothyroxine (SYNTHROID) 150 MCG tablet Take 150 mcg by mouth daily before breakfast.   No facility-administered encounter medications on file as of 01/23/2022.    Review of Systems:  Review of Systems  Constitutional:  Negative for activity change and appetite change.  HENT: Negative.    Respiratory:  Negative for cough and shortness of breath.   Cardiovascular:  Negative for leg swelling.  Gastrointestinal:  Negative for constipation.  Genitourinary: Negative.   Musculoskeletal:  Positive for gait problem. Negative for arthralgias and myalgias.  Skin:  Positive for rash.  Neurological:  Negative for dizziness and weakness.  Psychiatric/Behavioral:  Positive for dysphoric mood. Negative for confusion and sleep disturbance.     Health Maintenance  Topic Date Due   COVID-19 Vaccine (6 - Moderna risk series) 10/30/2021   INFLUENZA VACCINE  12/06/2021   TETANUS/TDAP  06/08/2022   Pneumonia Vaccine 34+ Years old  Completed   DEXA SCAN  Completed   Zoster Vaccines- Shingrix  Completed   HPV VACCINES  Aged Out   Hepatitis C Screening  Discontinued    Physical Exam: Vitals:   01/23/22 2138  BP: (!) 165/93  Pulse: 76  Resp: 16  Temp: (!) 97.5 F  (36.4 C)  Weight: 244 lb (110.7 kg)   Body mass index is 41.88 kg/m. Physical Exam Vitals reviewed.  Constitutional:      Appearance: Normal appearance.  HENT:     Head: Normocephalic.     Nose: Nose normal.     Mouth/Throat:     Mouth: Mucous membranes are moist.     Pharynx: Oropharynx is clear.  Eyes:     Pupils: Pupils are equal, round, and reactive to light.  Cardiovascular:     Rate and Rhythm: Normal rate and regular rhythm.     Pulses: Normal pulses.     Heart sounds: Normal heart sounds. No murmur heard. Pulmonary:     Effort: Pulmonary effort is normal.     Breath  sounds: Normal breath sounds.  Abdominal:     General: Abdomen is flat. Bowel sounds are normal.     Palpations: Abdomen is soft.  Musculoskeletal:        General: Swelling present.     Cervical back: Neck supple.  Skin:    General: Skin is warm.     Comments: Facial Rosacea   Neurological:     Mental Status: She is alert and oriented to person, place, and time.     Comments: Cannot Move her Left UE and Both LE    Psychiatric:        Mood and Affect: Mood normal.        Thought Content: Thought content normal.     Labs reviewed: Basic Metabolic Panel: Recent Labs    02/28/21 0000 08/11/21 0000  NA 143 137  K 4.1 4.1  CL 106 99  CO2 26* 23*  BUN 15 21  CREATININE 1.1 1.1  CALCIUM 9.1 9.6  TSH  --  1.75   Liver Function Tests: No results for input(s): "AST", "ALT", "ALKPHOS", "BILITOT", "PROT", "ALBUMIN" in the last 8760 hours. No results for input(s): "LIPASE", "AMYLASE" in the last 8760 hours. No results for input(s): "AMMONIA" in the last 8760 hours. CBC: No results for input(s): "WBC", "NEUTROABS", "HGB", "HCT", "MCV", "PLT" in the last 8760 hours. Lipid Panel: Recent Labs    02/28/21 0000  CHOL 188  HDL 44  LDLCALC 125  TRIG 97   Lab Results  Component Value Date   HGBA1C 5.6 02/28/2021    Procedures since last visit: No results found.  Assessment/Plan 1.  Primary chronic progressive multiple sclerosis [facility]) Power Wheelchair dependent Hoyer lift for transfer Urinary and Bowel Incontinence  2. Postoperative hypothyroidism TSH normal in 04/23  3. Venous insufficiency On Lasix Creat stable  4. Major depression, chronic Refuses Meds  5. Class 3 severe obesity due to excess calories with serious comorbidity and body mass index (BMI) of 40.0 to 44.9 in adult [facility]) Not interested in Diet Modification    Labs/tests ordered:  * No order type specified * Next appt:  Visit date not found

## 2022-05-02 ENCOUNTER — Encounter: Payer: Self-pay | Admitting: Internal Medicine

## 2022-05-02 ENCOUNTER — Non-Acute Institutional Stay (SKILLED_NURSING_FACILITY): Payer: Medicare Other | Admitting: Internal Medicine

## 2022-05-02 DIAGNOSIS — F329 Major depressive disorder, single episode, unspecified: Secondary | ICD-10-CM

## 2022-05-02 DIAGNOSIS — G35 Multiple sclerosis: Secondary | ICD-10-CM | POA: Diagnosis not present

## 2022-05-02 DIAGNOSIS — E890 Postprocedural hypothyroidism: Secondary | ICD-10-CM

## 2022-05-02 DIAGNOSIS — I872 Venous insufficiency (chronic) (peripheral): Secondary | ICD-10-CM | POA: Diagnosis not present

## 2022-05-02 NOTE — Progress Notes (Signed)
Location:  [place] Room Number: [place] of Service:  SNF [phone]) Provider:  Virgie Dad, MD   Virgie Dad, MD  Patient Care Team: Virgie Dad, MD as PCP - General (Internal Medicine) Royal Hawthorn, NP as Nurse Practitioner (Nurse Practitioner)  Extended Emergency Contact Information Primary Emergency Contact: [name] States of [place] Phone: [phone] Relation: Daughter  Code Status:  DNR Goals of care: Advanced Directive information    05/02/2022    2:58 PM  Advanced Directives  Does Patient Have a Medical Advance Directive? Yes  Type of Advance Directive Living will;Out of facility DNR (pink MOST or yellow form)  Does patient want to make changes to medical advance directive? No - Patient declined     Chief Complaint  Patient presents with   Medical Management of Chronic Issues    Patient is being seen for a Routine     HPI:  Pt is a 76 y.o. female seen today for medical management of chronic diseases.    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 No new Nursing issues. No Behavior issues Her weight is stable Hoyer lift Dependent Stays in her room all the time No Falls Wt Readings from Last 3 Encounters:  05/02/22 245 lb 3.2 oz (111.2 kg)  01/23/22 244 lb (110.7 kg)  11/04/21 246 lb 3.2 oz (111.7 kg)      Past Medical History:  Diagnosis Date   Acute bronchitis    Per [facility] ([facility])    Age-related osteoporosis without current pathological fracture    Per [facility])    Anemia, unspecified    Per [facility] ([facility])    Atherosclerotic heart disease of native coronary artery without  angina pectoris    Per [facility] ([facility])    Breast cancer ([place]) 10/08   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   Cervical atypia    Cervical atypia    Per records received from [facility]    Constipation    Per [facility] ([facility])    Edema    Per [facility] ([facility])    Fracture of right lower leg 02/2012   NO SURGERY   Goiter    On synthroid    History of toxic multinodular goiter    Hypertension    Hypertensive nephropathy    Per [facility] medical records [facility])    Hypothyroidism, unspecified    Per [facility] education [facility] electronic medical records system)    Multinodular goiter    Per records received from [facility]   Multiple sclerosis ([place])    PRIMARY PROGRESSIVE- NEUROLOGIST IS DR. [place]; -INDEPENDENT LIVING USING POWER CHAIR - DRIVES - WORKS- NOT ABLE TO AMBULATE- CAN PULL UP AND TRANSFER.; WEARS DIAPER - UNABLE TO GET TO BATHROOM.   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 ([place])    Per [facility] ([facility])    Vitamin D deficiency    Per [facility] ([facility])    Past Surgical History:  Procedure Laterality Date   bone spur     removed from rt large toe   BREAST LUMPECTOMY Right 12/06/2006   Per records received form [facility]    CATARACT EXTRACTION, BILATERAL     CERVICAL CONIZATION  W/BX  1973   KNEE ARTHROSCOPY W/ MENISCAL REPAIR  2002   MASTECTOMY, RADICAL  10/08   BILATERAL  ( RIGHT BREAST CANCER )   THYROIDECTOMY N/A 04/23/2014   Procedure: TOTAL THYROIDECTOMY;  Surgeon: Armandina Gemma, MD;  Location: [facility];  Service: General;  Laterality: N/A;   TUBAL LIGATION  1979   Per records received form [facility]     Allergies  Allergen Reactions   Losartan Potassium     Decreased GFR, per Dr. Felipa Eth    Outpatient Encounter Medications as of 05/02/2022  Medication Sig   acetaminophen (TYLENOL) 325 MG tablet Take 650 mg by mouth every 4 (four) hours as needed.   acetaminophen (TYLENOL) 325 MG tablet Take 650 mg by mouth at bedtime.   ascorbic acid (VITAMIN C) 500 MG tablet Take 500 mg by mouth daily.   Calcium Carb-Cholecalciferol (CALCIUM-VITAMIN D) 500-200 MG-UNIT tablet Take 1 tablet by mouth daily.   Cyanocobalamin 1000 MCG TBCR Take 1 tablet by mouth daily.    furosemide (LASIX) 40 MG tablet Take 40 mg by mouth daily.   levothyroxine (SYNTHROID) 150 MCG tablet Take 150 mcg by mouth daily before breakfast.   No facility-administered encounter medications on file as of 05/02/2022.    Review of Systems  Constitutional:  Negative for activity change and appetite change.  HENT: Negative.    Respiratory:  Negative for cough and shortness of breath.   Cardiovascular:  Negative for leg swelling.  Gastrointestinal:  Negative for constipation.  Genitourinary: Negative.   Musculoskeletal:  Positive for gait problem. Negative for arthralgias and myalgias.  Skin: Negative.   Neurological:  Negative for dizziness and weakness.  Psychiatric/Behavioral:  Negative for confusion, dysphoric mood and sleep disturbance.     Immunization History  Administered Date(s) Administered   Fluad Quad(high Dose 65+) 02/09/2021, 03/13/2022   Influenza Split 02/09/2012   Influenza, High Dose Seasonal PF 02/14/2019, 02/27/2020   Influenza-Unspecified 01/25/2017, 02/02/2018    Moderna Covid-19 Vaccine Bivalent Booster 7yr & up 09/04/2021   Moderna Sars-Covid-2 Vaccination 05/20/2019, 06/18/2019, 03/24/2020, 02/16/2021   Pneumococcal Conjugate-13 07/28/2013   Pneumococcal Polysaccharide-23 03/20/2007, 02/09/2012   Tdap 11/27/2002   Tetanus 06/08/2012   Zoster Recombinat (Shingrix) 07/27/2014, 03/29/2015   Zoster, Live 07/06/2012   Pertinent  Health Maintenance Due  Topic Date Due   INFLUENZA VACCINE  Completed   DEXA SCAN  Completed       No data to display         Functional Status Survey:    Vitals:   05/02/22 1458  BP: 135/86  Pulse: 90  Resp: 20  Temp: (!) 97.2 F (36.2 C)  TempSrc: Temporal  SpO2: 92%  Weight: 245 lb 3.2 oz (111.2 kg)  Height: '5\' 4"'$  (1.626 m)   Body mass index is 42.09 kg/m. Physical Exam Vitals reviewed.  Constitutional:  Appearance: Normal appearance.  HENT:     Head: Normocephalic.     Nose: Nose normal.     Mouth/Throat:     Mouth: Mucous membranes are moist.     Pharynx: Oropharynx is clear.  Eyes:     Pupils: Pupils are equal, round, and reactive to light.  Cardiovascular:     Rate and Rhythm: Normal rate and regular rhythm.     Pulses: Normal pulses.     Heart sounds: Normal heart sounds. No murmur heard. Pulmonary:     Effort: Pulmonary effort is normal.     Breath sounds: Normal breath sounds.  Abdominal:     General: Abdomen is flat. Bowel sounds are normal.     Palpations: Abdomen is soft.  Musculoskeletal:        General: No swelling.     Cervical back: Neck supple.  Skin:    General: Skin is warm.  Neurological:     Mental Status: She is alert.     Comments: Cannot Move her Left UE and Both LE  Psychiatric:        Mood and Affect: Mood normal.        Thought Content: Thought content normal.     Labs reviewed: Recent Labs    08/11/21 0000  NA 137  K 4.1  CL 99  CO2 23*  BUN 21  CREATININE 1.1  CALCIUM 9.6   No results for input(s): "AST", "ALT", "ALKPHOS",  "BILITOT", "PROT", "ALBUMIN" in the last 8760 hours. No results for input(s): "WBC", "NEUTROABS", "HGB", "HCT", "MCV", "PLT" in the last 8760 hours. Lab Results  Component Value Date   TSH 1.75 08/11/2021   Lab Results  Component Value Date   HGBA1C 5.6 02/28/2021   Lab Results  Component Value Date   CHOL 188 02/28/2021   HDL 44 02/28/2021   LDLCALC 125 02/28/2021   TRIG 97 02/28/2021    Significant Diagnostic Results in last 30 days:  No results found.  Assessment/Plan 1. Primary chronic progressive multiple sclerosis [facility]) Power Wheelchair Dependent Hoyer lift for transfers Urinary and Bowel incontinence 2. Postoperative hypothyroidism TSH normal in 04/23  3. Venous insufficiency Lasix   4. Major depression, chronic No Meds Stable    Family/ staff Communication:   Labs/tests ordered:

## 2022-07-31 ENCOUNTER — Non-Acute Institutional Stay (SKILLED_NURSING_FACILITY): Payer: Medicare Other | Admitting: Internal Medicine

## 2022-07-31 ENCOUNTER — Encounter: Payer: Self-pay | Admitting: Internal Medicine

## 2022-07-31 DIAGNOSIS — F329 Major depressive disorder, single episode, unspecified: Secondary | ICD-10-CM

## 2022-07-31 DIAGNOSIS — R635 Abnormal weight gain: Secondary | ICD-10-CM | POA: Diagnosis not present

## 2022-07-31 DIAGNOSIS — E890 Postprocedural hypothyroidism: Secondary | ICD-10-CM

## 2022-07-31 DIAGNOSIS — G35 Multiple sclerosis: Secondary | ICD-10-CM | POA: Diagnosis not present

## 2022-07-31 DIAGNOSIS — R600 Localized edema: Secondary | ICD-10-CM

## 2022-07-31 NOTE — Progress Notes (Signed)
Location:  [place] Room Number: [place] of Service:  SNF [phone]) Provider:  Virgie Dad, MD   Virgie Dad, MD  Patient Care Team: Virgie Dad, MD as PCP - General (Internal Medicine) Royal Hawthorn, NP as Nurse Practitioner (Nurse Practitioner)  Extended Emergency Contact Information Primary Emergency Contact: [name] States of [place] Phone: [phone] Relation: Daughter  Code Status:  DNR Goals of care: Advanced Directive information    07/31/2022   11:01 AM  Advanced Directives  Does Patient Have a Medical Advance Directive? Yes  Type of Advance Directive Living will  Does patient want to make changes to medical advance directive? No - Patient declined     Chief Complaint  Patient presents with   Medical Management of Chronic Issues    Patient being seen for a routine visit.   Quality Metric Gaps    AWV, updated covid vaccine and Tdap vaccine     HPI:  Pt is a 77 y.o. female seen today for medical management of chronic diseases.    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 In her Power Wheelchair Doe snto have any acute comlains Wt Readings from Last 3 Encounters:  07/31/22 248 lb 3.2 oz (112.6 kg)  05/02/22 245 lb 3.2 oz (111.2 kg)  01/23/22 244 lb (110.7 kg)       Past Medical History:  Diagnosis Date   Acute bronchitis    Per [facility])    Age-related osteoporosis without current pathological fracture    Per [facility]  [facility])    Anemia, unspecified    Per [facility])    Atherosclerotic heart disease of 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    Per [facility])    Breast cancer ([place]) 10/08   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   Cervical atypia    Cervical atypia    Per records received from [facility]    Constipation    Per [facility])    Edema    Per [facility])    Fracture of right lower leg 02/2012   NO SURGERY   Goiter    On synthroid    History of toxic multinodular goiter    Hypertension    Hypertensive nephropathy    Per [facility] education [facility] electronic medical records system)    Hypothyroidism, unspecified    Per [facility] Environmental education officer electronic medical records system)    Multinodular goiter    Per records received from [facility]   Multiple sclerosis ([place])    PRIMARY PROGRESSIVE- NEUROLOGIST IS DR. [place]; -INDEPENDENT LIVING USING POWER CHAIR - DRIVES - WORKS- NOT ABLE TO AMBULATE- CAN PULL UP AND TRANSFER.; WEARS DIAPER - UNABLE TO GET TO BATHROOM.   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 ([place])    Per [facility])    Vitamin D deficiency    Per [facility])    Past Surgical History:  Procedure Laterality Date   bone spur     removed  from rt large toe   BREAST LUMPECTOMY Right 12/06/2006   Per records received form [facility]    CATARACT EXTRACTION, BILATERAL     CERVICAL CONIZATION W/BX  1973   KNEE ARTHROSCOPY W/ MENISCAL REPAIR  2002   MASTECTOMY, RADICAL  10/08   BILATERAL  ( RIGHT BREAST CANCER )   THYROIDECTOMY N/A 04/23/2014   Procedure: TOTAL THYROIDECTOMY;  Surgeon: Armandina Gemma, MD;  Location: [facility];  Service: General;  Laterality: N/A;   TUBAL LIGATION  1979   Per records received form [facility]     Allergies  Allergen Reactions   Losartan Potassium     Decreased GFR, per Dr. Felipa Eth    Outpatient Encounter Medications as of 07/31/2022  Medication Sig   acetaminophen (TYLENOL) 325 MG tablet Take 650 mg by mouth every 4 (four) hours as needed.   acetaminophen (TYLENOL) 325 MG tablet Take 650 mg by mouth at bedtime.   ascorbic acid (VITAMIN C) 500 MG tablet Take 500 mg by mouth daily.   Calcium Carb-Cholecalciferol (CALCIUM-VITAMIN D) 500-200 MG-UNIT tablet Take 1 tablet by mouth daily.   Cyanocobalamin 1000 MCG TBCR Take 1 tablet by mouth daily.    furosemide (LASIX) 40 MG tablet Take 40 mg by mouth daily.   levothyroxine (SYNTHROID) 150 MCG tablet Take 150 mcg by mouth daily before breakfast.   No facility-administered encounter medications on file as of 07/31/2022.    Review of Systems  Constitutional:  Negative for activity change and appetite change.  HENT: Negative.    Respiratory:  Negative for cough and shortness of breath.   Cardiovascular:  Negative for leg swelling.  Gastrointestinal:  Negative for constipation.  Genitourinary: Negative.   Musculoskeletal:  Positive for gait problem. Negative for arthralgias and myalgias.  Skin: Negative.   Neurological:  Negative for dizziness and weakness.  Psychiatric/Behavioral:  Negative for confusion, dysphoric mood and sleep disturbance.     Immunization History  Administered Date(s) Administered   Fluad Quad(high Dose 65+)  02/09/2021, 03/13/2022   Influenza Split 02/09/2012   Influenza, High Dose Seasonal PF 02/14/2019, 02/27/2020   Influenza-Unspecified 01/25/2017, 02/02/2018   Moderna Covid-19 Vaccine Bivalent Booster 21yrs & up 09/04/2021   Moderna Sars-Covid-2 Vaccination 05/20/2019, 06/18/2019, 03/24/2020, 02/16/2021   Pneumococcal Conjugate-13 07/28/2013   Pneumococcal Polysaccharide-23 03/20/2007, 02/09/2012   Tdap 11/27/2002   Tetanus 06/08/2012   Zoster Recombinat (Shingrix) 07/27/2014, 03/29/2015   Zoster, Live 07/06/2012   Pertinent  Health Maintenance Due  Topic Date Due   INFLUENZA VACCINE  Completed   DEXA SCAN  Completed       No data to display         Functional Status Survey:    Vitals:   07/31/22 1058  BP: 137/81  Pulse: 90  Resp: 18  Temp: (!) 97.5 F (36.4 C)  TempSrc: Temporal  SpO2: 96%  Weight: 248 lb 3.2 oz (112.6 kg)  Height: 5\' 4"  (1.626 m)   Body mass index is 42.6 kg/m. Physical Exam Vitals reviewed.  Constitutional:      Appearance: Normal appearance.  HENT:     Head: Normocephalic.     Nose: Nose normal.     Mouth/Throat:     Mouth: Mucous membranes are moist.     Pharynx: Oropharynx is clear.  Eyes:     Pupils: Pupils are equal, round, and reactive to light.  Cardiovascular:     Rate and Rhythm: Normal rate and regular rhythm.     Pulses: Normal pulses.     Heart sounds: Normal heart sounds. No murmur heard. Pulmonary:     Effort: Pulmonary effort is normal.     Breath sounds: Normal breath sounds.  Abdominal:     General: Abdomen is flat. Bowel sounds are normal.     Palpations: Abdomen is soft.  Musculoskeletal:        General: Swelling present.     Cervical back: Neck supple.  Skin:    General: Skin is warm.  Neurological:     Mental Status: She is alert and oriented to person, place, and time.     Comments: Cannot Move her Left UE and Both LE   Psychiatric:        Mood and Affect: Mood normal.        Thought Content: Thought  content normal.     Labs reviewed: Recent Labs    08/11/21 0000  NA 137  K 4.1  CL 99  CO2 23*  BUN 21  CREATININE 1.1  CALCIUM 9.6   No results for input(s): "AST", "ALT", "ALKPHOS", "BILITOT", "PROT", "ALBUMIN" in the last 8760 hours. No results for input(s): "WBC", "NEUTROABS", "HGB", "HCT", "MCV", "PLT" in the last 8760 hours. Lab Results  Component Value Date   TSH 1.75 08/11/2021   Lab Results  Component Value Date   HGBA1C 5.6 02/28/2021   Lab Results  Component Value Date   CHOL 188 02/28/2021   HDL 44 02/28/2021   LDLCALC 125 02/28/2021   TRIG 97 02/28/2021    Significant Diagnostic Results in last 30 days:  No results found.  Assessment/Plan 1. Primary chronic progressive multiple sclerosis ([place]) Off all meds Wheelchair And Harrel Lemon Dependent Has failed treatment before She is Incontinence for Urine and Bowels Left  UE and LE weakness Does not see Neurologist anymore 2. Weight gain has gained 10 lbs in past year Portion control  Check Labs Dietary eval  3. Postoperative hypothyroidism Repeat TSH  4. Major depression, chronic Refuses Meds  5. Bilateral leg edema On Lasix    Family/ staff Communication:   Labs/tests ordered:  CBC,CMP,TSH

## 2022-08-01 LAB — COMPREHENSIVE METABOLIC PANEL
Albumin: 3.8 (ref 3.5–5.0)
Calcium: 9.6 (ref 8.7–10.7)
Globulin: 2.4
eGFR: 54

## 2022-08-01 LAB — CBC: RBC: 4.97 (ref 3.87–5.11)

## 2022-08-01 LAB — BASIC METABOLIC PANEL
BUN: 19 (ref 4–21)
CO2: 21 (ref 13–22)
Chloride: 105 (ref 99–108)
Creatinine: 1.1 (ref 0.5–1.1)
Glucose: 106
Potassium: 4.1 mEq/L (ref 3.5–5.1)
Sodium: 143 (ref 137–147)

## 2022-08-01 LAB — HEPATIC FUNCTION PANEL
ALT: 22 U/L (ref 7–35)
AST: 20 (ref 13–35)
Alkaline Phosphatase: 72 (ref 25–125)
Bilirubin, Total: 0.2

## 2022-08-01 LAB — TSH: TSH: 2.34 (ref 0.41–5.90)

## 2022-08-01 LAB — CBC AND DIFFERENTIAL
HCT: 42 (ref 36–46)
Hemoglobin: 14.4 (ref 12.0–16.0)
Platelets: 277 10*3/uL (ref 150–400)
WBC: 5

## 2022-10-23 ENCOUNTER — Encounter: Payer: Self-pay | Admitting: Internal Medicine

## 2022-10-23 ENCOUNTER — Non-Acute Institutional Stay (SKILLED_NURSING_FACILITY): Payer: Medicare Other | Admitting: Internal Medicine

## 2022-10-23 DIAGNOSIS — G35 Multiple sclerosis: Secondary | ICD-10-CM | POA: Diagnosis not present

## 2022-10-23 DIAGNOSIS — E890 Postprocedural hypothyroidism: Secondary | ICD-10-CM

## 2022-10-23 DIAGNOSIS — R600 Localized edema: Secondary | ICD-10-CM

## 2022-10-23 DIAGNOSIS — R635 Abnormal weight gain: Secondary | ICD-10-CM | POA: Diagnosis not present

## 2022-10-23 DIAGNOSIS — F329 Major depressive disorder, single episode, unspecified: Secondary | ICD-10-CM

## 2022-10-23 NOTE — Progress Notes (Signed)
Location:  [facility] Room Number: 137A Place of Service:  SNF [phone]) Provider:  Mahlon Gammon, MD   Mahlon Gammon, MD  Patient Care Team: Mahlon Gammon, MD as PCP - General (Internal Medicine) [name], NP as Nurse Practitioner (Nurse Practitioner)  Extended Emergency Contact Information Primary Emergency Contact: Shelia Media [place] Home Phone: [phone] Relation: Daughter  Code Status:  DNR Goals of care: Advanced Directive information    10/23/2022   11:30 AM  Advanced Directives  Does Patient Have a Medical Advance Directive? Yes  Type of Advance Directive Out of facility DNR (pink MOST or yellow form);Healthcare Power of [employer] of Healthcare Power of Attorney in Chart? Yes - validated most recent copy scanned in chart (See row information)     Chief Complaint  Patient presents with   Medical Management of Chronic Issues    Patient is being seen for a routine visit    Health Maintenance    Discuss the need for AWV, covid and tdap vaccine    HPI:  Pt is a 77 y.o. female seen today for medical management of chronic diseases.   Lives in SNF in [place]   Patient has h/o  Progressive MS Has Failed previous treatment Is off all meds H/o Breast Cancer s/p Bilateral Mastectomy Prediabetic Venous Deficiency, Neurogenic Bladder Hypothyroidism  Patient does not like to be seen for Routines We try to see her every 3 months Stays in Her room No Complains Uses Power Chair [employer] Lift dependent Wt Readings from Last 3 Encounters:  10/23/22 245 lb 3.2 oz (111.2 kg)  07/31/22 248 lb 3.2 oz (112.6 kg)  05/02/22 245 lb 3.2 oz (111.2 kg)    Her weight has stabilized She was gaining weight before and per dietary resistant to any change in her diet   Past Medical History:  Diagnosis Date   Acute bronchitis    Per [facility])    Age-related osteoporosis  without current pathological fracture    Per [facility])    Anemia, unspecified    Per [facility])    Atherosclerotic heart disease of 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    Per [facility])    Breast cancer (HCC) 10/08   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   Cervical atypia    Cervical atypia    Per records received from [facility]    Constipation    Per [facility])    Edema    Per [facility])    Fracture of right lower leg 02/2012   NO SURGERY   Goiter    On synthroid    History of toxic multinodular goiter    Hypertension    Hypertensive nephropathy    Per [facility]    Hypothyroidism, unspecified    Per [facility]    Multinodular goiter    Per records received from [facility]   Multiple sclerosis (HCC)    PRIMARY PROGRESSIVE- NEUROLOGIST IS DR. Harlen Labs  WITH [facility]; -INDEPENDENT LIVING USING POWER CHAIR - DRIVES - WORKS- NOT ABLE TO AMBULATE- CAN PULL UP AND TRANSFER.; WEARS DIAPER - UNABLE TO GET TO BATHROOM.   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)    Per [facility] ([facility])    Vitamin D deficiency    Per [facility]  records [facility])    Past Surgical History:  Procedure Laterality Date   bone spur     removed from rt large toe   BREAST LUMPECTOMY Right 12/06/2006   Per records received form [facility]    CATARACT EXTRACTION, BILATERAL     CERVICAL CONIZATION W/BX  1973   KNEE ARTHROSCOPY W/ MENISCAL REPAIR  2002   MASTECTOMY, RADICAL  10/08   BILATERAL  ( RIGHT BREAST CANCER )   THYROIDECTOMY N/A 04/23/2014   Procedure: TOTAL THYROIDECTOMY;  Surgeon: Darnell Level, MD;  Location: [facility];  Service: General;  Laterality: N/A;   TUBAL LIGATION  1979   Per records received form [facility]     Allergies  Allergen Reactions   Losartan Potassium     Decreased GFR, per Dr. Pete Glatter    Outpatient Encounter Medications as of 10/23/2022  Medication Sig   acetaminophen (TYLENOL) 325 MG tablet Take 650 mg by mouth every 4 (four) hours as needed.   acetaminophen (TYLENOL) 325 MG tablet Take 650 mg by mouth at bedtime.   ascorbic acid (VITAMIN C) 500 MG tablet Take 500 mg by mouth daily.   Calcium Carb-Cholecalciferol (CALCIUM-VITAMIN D) 500-200 MG-UNIT tablet Take 1 tablet by mouth daily.   Cyanocobalamin 1000 MCG TBCR Take 1 tablet by mouth daily.    furosemide (LASIX) 40 MG tablet Take 40 mg by mouth daily.   levothyroxine (SYNTHROID) 150 MCG tablet Take 150 mcg by mouth daily before breakfast.   No facility-administered encounter medications on file as of 10/23/2022.    Review of Systems  Constitutional:  Negative for activity change and appetite change.  HENT: Negative.    Respiratory:  Negative for cough and shortness of breath.   Cardiovascular:  Negative for leg swelling.  Gastrointestinal:  Negative for constipation.  Genitourinary: Negative.   Musculoskeletal:  Positive for gait problem. Negative for arthralgias and myalgias.  Skin: Negative.   Neurological:  Negative for dizziness and weakness.  Psychiatric/Behavioral:  Positive for dysphoric mood. Negative for confusion and  sleep disturbance.     Immunization History  Administered Date(s) Administered   Fluad Quad(high Dose 65+) 02/09/2021, 03/13/2022   Influenza Split 02/09/2012   Influenza, High Dose Seasonal PF 02/14/2019, 02/27/2020   Influenza-Unspecified 01/25/2017, 02/02/2018   Moderna Covid-19 Vaccine Bivalent Booster 89yrs & up 09/04/2021   Moderna Sars-Covid-2 Vaccination 05/20/2019, 06/18/2019, 03/24/2020, 02/16/2021   Pneumococcal Conjugate-13 07/28/2013   Pneumococcal Polysaccharide-23 03/20/2007, 02/09/2012   Tdap 11/27/2002   Tetanus 06/08/2012   Zoster Recombinat (Shingrix) 07/27/2014, 03/29/2015   Zoster, Live 07/06/2012   Pertinent  Health Maintenance Due  Topic Date Due   INFLUENZA VACCINE  12/07/2022   DEXA SCAN  Completed       No data to display         Functional Status Survey:    Vitals:   10/23/22 1127  BP: 135/85  Pulse:  80  Resp: 18  Temp: (!) 97.5 F (36.4 C)  TempSrc: Temporal  SpO2: 95%  Weight: 245 lb 3.2 oz (111.2 kg)  Height: 5\' 4"  (1.626 m)   Body mass index is 42.09 kg/m. Physical Exam Vitals reviewed.  Constitutional:      Appearance: Normal appearance.  HENT:     Head: Normocephalic.     Nose: Nose normal.     Mouth/Throat:     Mouth: Mucous membranes are moist.     Pharynx: Oropharynx is clear.  Eyes:     Pupils: Pupils are equal, round, and reactive to light.  Cardiovascular:     Rate and Rhythm: Normal rate and regular rhythm.     Pulses: Normal pulses.     Heart sounds: Normal heart sounds. No murmur heard. Pulmonary:     Effort: Pulmonary effort is normal.     Breath sounds: Normal breath sounds.  Abdominal:     General: Abdomen is flat. Bowel sounds are normal.     Palpations: Abdomen is soft.  Musculoskeletal:        General: No swelling.     Cervical back: Neck supple.  Skin:    General: Skin is warm.  Neurological:     Mental Status: She is alert and oriented to person, place, and time.     Comments: Cannot Move her  Left UE and Both LE    Psychiatric:        Mood and Affect: Mood normal.        Thought Content: Thought content normal.     Labs reviewed: Recent Labs    08/01/22 0000  NA 143  K 4.1  CL 105  CO2 21  BUN 19  CREATININE 1.1  CALCIUM 9.6   Recent Labs    08/01/22 0000  AST 20  ALT 22  ALKPHOS 72  ALBUMIN 3.8   Recent Labs    08/01/22 0000  WBC 5.0  HGB 14.4  HCT 42  PLT 277   Lab Results  Component Value Date   TSH 2.34 08/01/2022   Lab Results  Component Value Date   HGBA1C 5.6 02/28/2021   Lab Results  Component Value Date   CHOL 188 02/28/2021   HDL 44 02/28/2021   LDLCALC 125 02/28/2021   TRIG 97 02/28/2021    Significant Diagnostic Results in last 30 days:  No results found.  Assessment/Plan 1. Primary chronic progressive multiple sclerosis (HCC) Off all her Meds Wheelchair and Michiel Sites dependent Has failed all treatments Incontinence of bladder and Bowel Does not follow with Neurologist   2. Weight gain Weight has stabilized Resistant to portion control  3. Postoperative hypothyroidism TSH normal in 3/24  4. Major depression, chronic No Meds for now  5. Bilateral leg edema Lasix    Family/ staff Communication:   Labs/tests ordered:

## 2022-10-23 NOTE — Progress Notes (Signed)
Abstraction  

## 2023-01-29 ENCOUNTER — Encounter: Payer: Self-pay | Admitting: Internal Medicine

## 2023-01-29 ENCOUNTER — Non-Acute Institutional Stay (SKILLED_NURSING_FACILITY): Payer: Medicare Other | Admitting: Internal Medicine

## 2023-01-29 DIAGNOSIS — F329 Major depressive disorder, single episode, unspecified: Secondary | ICD-10-CM

## 2023-01-29 DIAGNOSIS — R635 Abnormal weight gain: Secondary | ICD-10-CM

## 2023-01-29 DIAGNOSIS — G35 Multiple sclerosis: Secondary | ICD-10-CM | POA: Diagnosis not present

## 2023-01-29 DIAGNOSIS — R600 Localized edema: Secondary | ICD-10-CM

## 2023-01-29 DIAGNOSIS — E890 Postprocedural hypothyroidism: Secondary | ICD-10-CM

## 2023-01-29 NOTE — Progress Notes (Signed)
Location:  [facility] Room Number: 137A Place of Service:  SNF [phone]) Provider:  Mahlon Gammon, MD   Mahlon Gammon, MD  Patient Care Team: Mahlon Gammon, MD as PCP - General (Internal Medicine) [name], NP as Nurse Practitioner (Nurse Practitioner)  Extended Emergency Contact Information Primary Emergency Contact: Shelia Media [place] Home Phone: [phone] Relation: Daughter  Code Status:  DNR Goals of care: Advanced Directive information    01/29/2023    4:16 PM  Advanced Directives  Does Patient Have a Medical Advance Directive? Yes  Type of Estate agent of [place];Out of facility DNR (pink MOST or yellow form)  Does patient want to make changes to medical advance directive? No - Patient declined  Copy of Healthcare Power of Attorney in Chart? Yes - validated most recent copy scanned in chart (See row information)     Chief Complaint  Patient presents with   Medical Management of Chronic Issues    Patient is being seen for a routine visit   Immunizations    Patient is due for a tdap, flu and covid vaccine    Health Maintenance    Patient is due for AWV    HPI:  Pt is a 77 y.o. female seen today for medical management of chronic diseases.    Lives in SNF in [place]   Patient has h/o  Progressive MS Has Failed previous treatment Is off all meds H/o Breast Cancer s/p Bilateral Mastectomy Prediabetic Venous Deficiency, Neurogenic Bladder Hypothyroidism   Patient does not like to be seen for Routines We try to see her every 3 months Had No complains She keeps gaining weight and refused Dietary modifications Stays in her room [employer] dependent Uses Power chair Wt Readings from Last 3 Encounters:  01/29/23 253 lb (114.8 kg)  10/23/22 245 lb 3.2 oz (111.2 kg)  07/31/22 248 lb 3.2 oz (112.6 kg)      Past Medical History:  Diagnosis Date   Acute bronchitis    Per [facility])    Age-related osteoporosis without current pathological fracture    Per [facility])    Anemia, unspecified    Per [facility])    Atherosclerotic heart disease of 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    Per [facility])    Breast cancer (HCC) 10/08   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   Cervical atypia    Cervical atypia    Per records received from [facility]    Constipation    Per [facility])    Edema    Per [facility])    Fracture of right lower leg 02/2012   NO SURGERY   Goiter    On synthroid    History of toxic multinodular goiter    Hypertension    Hypertensive nephropathy    Per [facility])    Hypothyroidism, unspecified    Per [facility])    Multinodular goiter    Per records received from [facility]   Multiple sclerosis (HCC)    PRIMARY PROGRESSIVE- NEUROLOGIST IS DR. Harlen Labs  WITH [facility]; -INDEPENDENT LIVING USING POWER CHAIR - DRIVES - WORKS- NOT ABLE TO AMBULATE- CAN PULL UP AND TRANSFER.; WEARS DIAPER - UNABLE TO GET  TO BATHROOM.   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)    Per [facility])     Vitamin D deficiency    Per [facility])    Past Surgical History:  Procedure Laterality Date   bone spur     removed from rt large toe   BREAST LUMPECTOMY Right 12/06/2006   Per records received form [facility]    CATARACT EXTRACTION, BILATERAL     CERVICAL CONIZATION W/BX  1973   KNEE ARTHROSCOPY W/ MENISCAL REPAIR  2002   MASTECTOMY, RADICAL  10/08   BILATERAL  ( RIGHT BREAST CANCER )   THYROIDECTOMY N/A 04/23/2014   Procedure: TOTAL THYROIDECTOMY;  Surgeon: Darnell Level, MD;  Location: [facility];  Service: General;  Laterality: N/A;   TUBAL LIGATION  1979   Per records received form [facility]     Allergies  Allergen Reactions   Losartan Potassium     Decreased GFR, per Dr. Pete Glatter    Outpatient Encounter Medications as of 01/29/2023  Medication Sig   acetaminophen (TYLENOL) 325 MG tablet Take 650 mg by mouth every 4 (four) hours as needed.   acetaminophen (TYLENOL) 325 MG tablet Take 650 mg by mouth at bedtime.   ascorbic acid (VITAMIN C) 500 MG tablet Take 500 mg by mouth daily.   Calcium Carb-Cholecalciferol (CALCIUM-VITAMIN D) 500-200 MG-UNIT tablet Take 1 tablet by mouth daily.   Cyanocobalamin 1000 MCG TBCR Take 1 tablet by mouth daily.    furosemide (LASIX) 40 MG tablet Take 40 mg by mouth daily.   levothyroxine (SYNTHROID) 150 MCG tablet Take 150 mcg by mouth daily before breakfast.   No facility-administered encounter medications on file as of 01/29/2023.    Review of Systems  Constitutional:  Negative for activity change and appetite change.  HENT: Negative.    Respiratory:  Negative for cough and shortness of breath.   Cardiovascular:  Positive for leg swelling.  Gastrointestinal:  Negative for constipation.  Genitourinary: Negative.   Musculoskeletal:  Positive for gait problem. Negative for arthralgias and myalgias.  Skin: Negative.   Neurological:  Negative for dizziness and weakness.   Psychiatric/Behavioral:  Negative for confusion, dysphoric mood and sleep disturbance.     Immunization History  Administered Date(s) Administered   Fluad Quad(high Dose 65+) 02/09/2021, 03/13/2022   Influenza Split 02/09/2012   Influenza, High Dose Seasonal PF 02/14/2019, 02/27/2020   Influenza-Unspecified 01/25/2017, 02/02/2018   Moderna Covid-19 Vaccine Bivalent Booster 66yrs & up 09/04/2021   Moderna Sars-Covid-2 Vaccination 05/20/2019, 06/18/2019, 03/24/2020, 02/16/2021   Pneumococcal Conjugate-13 07/28/2013   Pneumococcal Polysaccharide-23 03/20/2007, 02/09/2012   Tdap 11/27/2002   Tetanus 06/08/2012   Zoster Recombinant(Shingrix) 07/27/2014, 03/29/2015   Zoster, Live 07/06/2012   Pertinent  Health Maintenance Due  Topic Date Due   INFLUENZA VACCINE  12/07/2022   DEXA SCAN  Completed      01/29/2023    4:14 PM  Fall Risk  Falls in the past year?  0  Was there an injury with Fall? 0  Fall Risk Category Calculator 0  Patient at Risk for Falls Due to No Fall Risks  Fall risk Follow up Falls evaluation completed   Functional Status Survey:    Vitals:   01/29/23 1612  BP: (!) 168/81  Pulse: 90  Resp: 20  Temp: (!) 97.3 F (36.3 C)  TempSrc: Temporal  SpO2: 94%  Weight: 253 lb (114.8 kg)  Height: 5\' 4"  (1.626 m)   Body mass index is 43.43 kg/m. Physical Exam Vitals reviewed.  Constitutional:      Appearance: Normal appearance.  HENT:     Head: Normocephalic.     Nose: Nose normal.     Mouth/Throat:     Mouth: Mucous membranes are moist.     Pharynx: Oropharynx is clear.  Eyes:     Pupils: Pupils are equal, round, and reactive to light.  Cardiovascular:     Rate and Rhythm: Normal rate and regular rhythm.     Pulses: Normal pulses.     Heart sounds: Normal heart sounds. No murmur heard. Pulmonary:     Effort: Pulmonary effort is normal.     Breath sounds: Normal breath sounds.  Abdominal:     General: Abdomen is flat. Bowel sounds are normal.      Palpations: Abdomen is soft.  Musculoskeletal:        General: Swelling present.     Cervical back: Neck supple.  Skin:    General: Skin is warm.  Neurological:     Mental Status: She is alert and oriented to person, place, and time.     Comments: Has both sensory and Motor loss in LE Cannot Move her Left UE and Both LE  Psychiatric:        Mood and Affect: Mood normal.        Thought Content: Thought content normal.     Labs reviewed: Recent Labs    08/01/22 0000  NA 143  K 4.1  CL 105  CO2 21  BUN 19  CREATININE 1.1  CALCIUM 9.6   Recent Labs    08/01/22 0000  AST 20  ALT 22  ALKPHOS 72  ALBUMIN 3.8   Recent Labs    08/01/22 0000  WBC 5.0  HGB 14.4  HCT 42  PLT 277   Lab Results  Component Value Date   TSH 2.34 08/01/2022   Lab Results  Component Value Date   HGBA1C 5.6 02/28/2021   Lab Results  Component Value Date   CHOL 188 02/28/2021   HDL 44 02/28/2021   LDLCALC 125 02/28/2021   TRIG 97 02/28/2021    Significant Diagnostic Results in last 30 days:  No results found.  Assessment/Plan 1. Primary chronic progressive multiple sclerosis (HCC) Off All meds Wheelchair and Michiel Sites dependent Has failed treatments Does not follow with Neurology Bladder and Bowel Incontinence  2. Weight gain Needs Portion control Will talk ot Dietary again Has resisted before   3. Postoperative hypothyroidism TSH normal in 03/24  4. Major depression, chronic No Meds for now  5. Bilateral leg edema Lasix    Family/ staff Communication:   Labs/tests ordered:

## 2023-02-02 ENCOUNTER — Encounter: Payer: Self-pay | Admitting: Internal Medicine

## 2023-04-16 ENCOUNTER — Non-Acute Institutional Stay (SKILLED_NURSING_FACILITY): Payer: Medicare Other | Admitting: Internal Medicine

## 2023-04-16 DIAGNOSIS — G35 Multiple sclerosis: Secondary | ICD-10-CM

## 2023-04-16 DIAGNOSIS — E890 Postprocedural hypothyroidism: Secondary | ICD-10-CM

## 2023-04-16 DIAGNOSIS — F329 Major depressive disorder, single episode, unspecified: Secondary | ICD-10-CM | POA: Diagnosis not present

## 2023-04-16 DIAGNOSIS — R600 Localized edema: Secondary | ICD-10-CM | POA: Diagnosis not present

## 2023-04-16 NOTE — Progress Notes (Signed)
Location:  Medical illustrator of Service:  SNF ([id number])  Provider:   Code Status: DNR  Goals of Care:     01/29/2023    4:16 PM  Advanced Directives  Does Patient Have a Medical Advance Directive? Yes  Type of Estate agent of [place];Out of facility DNR (pink MOST or yellow form)  Does patient want to make changes to medical advance directive? No - Patient declined  Copy of Healthcare Power of Attorney in Chart? Yes - validated most recent copy scanned in chart (See row information)     Chief Complaint  Patient presents with   Care Management    HPI: Patient is a 77 y.o. female seen today for medical management of chronic diseases.   Lives in SNF in [place]   Patient has h/o  Progressive MS Has Failed previous treatment Is off all meds H/o Breast Cancer s/p Bilateral Mastectomy Prediabetic Venous Deficiency, Neurogenic Bladder Hypothyroidism  Patient does not like to be seen for Routines We try to see her every 3 months  She keeps gaining weight and refuses Dietary modifications Stays in her room Michiel Sites dependent Uses Power chair  Had No Complains today Wt Readings from Last 3 Encounters:  04/16/23 255 lb (115.7 kg)  01/29/23 253 lb (114.8 kg)  10/23/22 245 lb 3.2 oz (111.2 kg)     Past Medical History:  Diagnosis Date   Acute bronchitis    Per [facility])    Age-related osteoporosis without current pathological fracture    Per [facility])    Anemia, unspecified    Per [facility])    Atherosclerotic heart disease of 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    Per [facility])    Breast cancer (HCC) 10/08   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   Cervical atypia    Cervical atypia    Per records received from [facility]     Constipation    Per [facility])    Edema    Per [facility])    Fracture of right lower leg 02/2012   NO SURGERY   Goiter    On synthroid    History of toxic multinodular goiter    Hypertension    Hypertensive nephropathy    Per [facility])    Hypothyroidism, unspecified    Per [facility])    Multinodular goiter    Per records received from [facility]   Multiple sclerosis (HCC)    PRIMARY PROGRESSIVE- NEUROLOGIST IS DR. Harlen Labs  WITH [facility]; -INDEPENDENT LIVING USING POWER CHAIR - DRIVES - WORKS- NOT ABLE TO AMBULATE- CAN PULL UP AND TRANSFER.; WEARS DIAPER - UNABLE TO GET TO BATHROOM.   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)    Per  [facility])    Vitamin D deficiency    Per [facility])     Past Surgical History:  Procedure Laterality Date   bone spur     removed from rt large toe   BREAST LUMPECTOMY Right 12/06/2006   Per records received form [facility]    CATARACT EXTRACTION, BILATERAL     CERVICAL CONIZATION W/BX  1973   KNEE ARTHROSCOPY W/ MENISCAL REPAIR  2002   MASTECTOMY, RADICAL  10/08   BILATERAL  ( RIGHT BREAST CANCER )   THYROIDECTOMY N/A 04/23/2014   Procedure: TOTAL THYROIDECTOMY;  Surgeon: Darnell Level, MD;  Location: [facility];   Service: General;  Laterality: N/A;   TUBAL LIGATION  1979   Per records received form [facility]     Allergies  Allergen Reactions   Losartan Potassium     Decreased GFR, per Dr. Pete Glatter    Outpatient Encounter Medications as of 04/16/2023  Medication Sig   acetaminophen (TYLENOL) 325 MG tablet Take 650 mg by mouth every 4 (four) hours as needed.   acetaminophen (TYLENOL) 325 MG tablet Take 650 mg by mouth at bedtime.   ascorbic acid (VITAMIN C) 500 MG tablet Take 500 mg by mouth daily.   Calcium Carb-Cholecalciferol (CALCIUM-VITAMIN D) 500-200 MG-UNIT tablet Take 1 tablet by mouth daily.   Cyanocobalamin 1000 MCG TBCR Take 1 tablet by mouth daily.    furosemide (LASIX) 40 MG tablet Take 40 mg by mouth daily.   levothyroxine (SYNTHROID) 150 MCG tablet Take 150 mcg by mouth daily before breakfast.   No facility-administered encounter medications on file as of 04/16/2023.    Review of Systems:  Review of Systems  Constitutional:  Negative for activity change and appetite change.  HENT: Negative.    Respiratory:  Negative for cough and shortness of breath.   Cardiovascular:  Negative for leg swelling.  Gastrointestinal:  Negative for constipation.  Genitourinary: Negative.   Musculoskeletal:  Positive for gait problem. Negative for arthralgias and myalgias.  Skin: Negative.   Neurological:  Negative for dizziness and weakness.  Psychiatric/Behavioral:  Positive for dysphoric mood. Negative for confusion and sleep disturbance.     Health Maintenance  Topic Date Due   Medicare Annual Wellness (AWV)  Never done   DTaP/Tdap/Td (3 - Td or Tdap) 06/08/2022   INFLUENZA VACCINE  12/07/2022   COVID-19 Vaccine (6 - 2023-24 season) 01/07/2023   Pneumonia Vaccine 71+ Years old  Completed   DEXA SCAN  Completed   Zoster Vaccines- Shingrix  Completed   HPV VACCINES  Aged Out   Hepatitis C Screening  Discontinued    Physical Exam: There were no vitals filed for this  visit. There is no height or weight on file to calculate BMI. Physical Exam Vitals reviewed.  Constitutional:      Appearance: Normal appearance.  HENT:     Head: Normocephalic.     Nose: Nose normal.     Mouth/Throat:     Mouth: Mucous membranes are moist.     Pharynx: Oropharynx is clear.  Eyes:     Pupils: Pupils are equal, round, and reactive to light.  Cardiovascular:     Rate and Rhythm: Normal rate and regular rhythm.     Pulses: Normal pulses.     Heart sounds: Normal heart sounds. No murmur heard. Pulmonary:     Effort: Pulmonary effort is normal.     Breath sounds: Normal breath sounds.  Abdominal:  General: Abdomen is flat. Bowel sounds are normal.     Palpations: Abdomen is soft.  Musculoskeletal:        General: No swelling.     Cervical back: Neck supple.  Skin:    General: Skin is warm.  Neurological:     Mental Status: She is alert and oriented to person, place, and time.     Comments: Has both sensory and Motor loss in LE Cannot Move her Left UE and Both LE    Psychiatric:        Mood and Affect: Mood normal.        Thought Content: Thought content normal.     Labs reviewed: Basic Metabolic Panel: Recent Labs    08/01/22 0000  NA 143  K 4.1  CL 105  CO2 21  BUN 19  CREATININE 1.1  CALCIUM 9.6  TSH 2.34   Liver Function Tests: Recent Labs    08/01/22 0000  AST 20  ALT 22  ALKPHOS 72  ALBUMIN 3.8   No results for input(s): "LIPASE", "AMYLASE" in the last 8760 hours. No results for input(s): "AMMONIA" in the last 8760 hours. CBC: Recent Labs    08/01/22 0000  WBC 5.0  HGB 14.4  HCT 42  PLT 277   Lipid Panel: No results for input(s): "CHOL", "HDL", "LDLCALC", "TRIG", "CHOLHDL", "LDLDIRECT" in the last 8760 hours. Lab Results  Component Value Date   HGBA1C 5.6 02/28/2021    Procedures since last visit: No results found.  Assessment/Plan 1. Primary chronic progressive multiple sclerosis (HCC) Off All meds Wheelchair  and Michiel Sites dependent Has failed treatments Does not follow with Neurology Bladder and Bowel Incontinence  2. Major depression, chronic No Meds  3. Bilateral leg edema Lasix  4. Postoperative hypothyroidism TSH normal in 03/24 5 Weight Gain Refuses Portion Control per Dietary   Labs/tests ordered:  * No order type specified * Next appt:  Visit date not found

## 2023-04-22 ENCOUNTER — Encounter: Payer: Self-pay | Admitting: Internal Medicine

## 2023-05-23 ENCOUNTER — Telehealth: Payer: Self-pay

## 2023-05-23 NOTE — Telephone Encounter (Signed)
 Susana Enter the document coordinator from [employer] called and states that last week a form was faxed over about patient having power wheelchair repair. I called Jefrey but phone went to voicemail. I left a message notified him that I didn't see paper was place in provider mailbox and to please fax order again at [phone].

## 2023-07-23 ENCOUNTER — Encounter: Payer: Self-pay | Admitting: Internal Medicine

## 2023-07-23 ENCOUNTER — Non-Acute Institutional Stay (SKILLED_NURSING_FACILITY): Payer: Self-pay | Admitting: Internal Medicine

## 2023-07-23 DIAGNOSIS — E890 Postprocedural hypothyroidism: Secondary | ICD-10-CM

## 2023-07-23 DIAGNOSIS — R600 Localized edema: Secondary | ICD-10-CM | POA: Diagnosis not present

## 2023-07-23 DIAGNOSIS — F329 Major depressive disorder, single episode, unspecified: Secondary | ICD-10-CM

## 2023-07-23 DIAGNOSIS — R635 Abnormal weight gain: Secondary | ICD-10-CM

## 2023-07-23 DIAGNOSIS — G35 Multiple sclerosis: Secondary | ICD-10-CM

## 2023-07-23 NOTE — Progress Notes (Signed)
 Location:  Medical illustrator of Service:  SNF (31)  Provider:   Code Status: DNR Goals of Care:     01/29/2023    4:16 PM  Advanced Directives  Does Patient Have a Medical Advance Directive? Yes  Type of Estate agent of [place];Out of facility DNR (pink MOST or yellow form)  Does patient want to make changes to medical advance directive? No - Patient declined  Copy of Healthcare Power of Attorney in Chart? Yes - validated most recent copy scanned in chart (See row information)     Chief Complaint  Patient presents with   Care Management    HPI: Patient is a 78 y.o. female seen today for medical management of chronic diseases.    Lives in SNF in [place]   Patient has h/o  Progressive MS Has Failed previous treatment Is off all meds H/o Breast Cancer s/p Bilateral Mastectomy Prediabetic Venous Deficiency, Neurogenic Bladder Hypothyroidism  Stays in her Room Has no Issues Hoyer dependent Uses Power chair No Complains Wt Readings from Last 3 Encounters:  07/23/23 258 lb (117 kg)  04/16/23 255 lb (115.7 kg)  01/29/23 253 lb (114.8 kg)      Past Medical History:  Diagnosis Date   Acute bronchitis    Per [facility])    Age-related osteoporosis without current pathological fracture    Per [facility])    Anemia, unspecified    Per [facility])    Atherosclerotic heart disease of 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    Per [facility])    Breast cancer (HCC) 10/08   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   Cervical atypia    Cervical atypia    Per records received from [facility]    Constipation    Per [facility])    Edema    Per [facility])     Fracture of right lower leg 02/2012   NO SURGERY   Goiter    On synthroid    History of toxic multinodular goiter    Hypertension    Hypertensive nephropathy    Per [facility])    Hypothyroidism, unspecified    Per [facility])    Multinodular goiter    Per records received from [facility]   Multiple sclerosis (HCC)    PRIMARY PROGRESSIVE- NEUROLOGIST IS DR. Harlen Labs  WITH [facility]; -INDEPENDENT LIVING USING POWER CHAIR - DRIVES - WORKS- NOT ABLE TO AMBULATE- CAN PULL UP AND TRANSFER.; WEARS DIAPER - UNABLE TO GET TO BATHROOM.   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)    Per [facility])    Vitamin D deficiency    Per [facility])     Past  Surgical History:  Procedure Laterality Date   bone spur     removed from rt large toe   BREAST LUMPECTOMY Right 12/06/2006   Per records received form [facility]    CATARACT EXTRACTION, BILATERAL     CERVICAL CONIZATION W/BX  1973   KNEE ARTHROSCOPY W/ MENISCAL REPAIR  2002   MASTECTOMY, RADICAL  10/08   BILATERAL  ( RIGHT BREAST CANCER )   THYROIDECTOMY N/A 04/23/2014   Procedure: TOTAL THYROIDECTOMY;  Surgeon: Darnell Level, MD;  Location: [facility];  Service: General;  Laterality: N/A;   TUBAL LIGATION  1979   Per records received form [facility]     Allergies  Allergen Reactions   Losartan  Potassium     Decreased GFR, per Dr. Pete Glatter    Outpatient Encounter Medications as of 07/23/2023  Medication Sig   acetaminophen (TYLENOL) 325 MG tablet Take 650 mg by mouth every 4 (four) hours as needed.   acetaminophen (TYLENOL) 325 MG tablet Take 650 mg by mouth at bedtime.   ascorbic acid (VITAMIN C) 500 MG tablet Take 500 mg by mouth daily.   Calcium Carb-Cholecalciferol (CALCIUM-VITAMIN D) 500-200 MG-UNIT tablet Take 1 tablet by mouth daily.   Cyanocobalamin 1000 MCG TBCR Take 1 tablet by mouth daily.    furosemide (LASIX) 40 MG tablet Take 40 mg by mouth daily.   levothyroxine (SYNTHROID) 150 MCG tablet Take 150 mcg by mouth daily before breakfast.   No facility-administered encounter medications on file as of 07/23/2023.    Review of Systems:  Review of Systems  Constitutional:  Negative for activity change and appetite change.  HENT: Negative.    Respiratory:  Negative for cough and shortness of breath.   Cardiovascular:  Negative for leg swelling.  Gastrointestinal:  Positive for constipation.  Genitourinary: Negative.   Musculoskeletal:  Positive for gait problem. Negative for arthralgias and myalgias.  Skin: Negative.   Neurological:  Negative for dizziness and weakness.  Psychiatric/Behavioral:  Positive for dysphoric mood. Negative for confusion and sleep disturbance.     Health Maintenance  Topic Date Due   Medicare Annual Wellness (AWV)  Never done   DTaP/Tdap/Td (3 - Td or Tdap) 06/08/2022   INFLUENZA VACCINE  12/07/2022   COVID-19 Vaccine (6 - 2024-25 season) 01/07/2023   Pneumonia Vaccine 65+ Years old  Completed   DEXA SCAN  Completed   Zoster Vaccines- Shingrix  Completed   HPV VACCINES  Aged Out   Hepatitis C Screening  Discontinued    Physical Exam: Vitals:   07/23/23 2108  BP: 130/64  Pulse: 89  Resp: 16  Temp: (!) 97.5 F (36.4 C)  Weight: 258 lb (117 kg)   Body mass index is 44.29 kg/m. Physical Exam Vitals reviewed.   Constitutional:      Appearance: Normal appearance. She is obese.  HENT:     Head: Normocephalic.     Nose: Nose normal.     Mouth/Throat:     Mouth: Mucous membranes are moist.     Pharynx: Oropharynx is clear.  Eyes:     Pupils: Pupils are equal, round, and reactive to light.  Cardiovascular:     Rate and Rhythm: Normal rate and regular rhythm.     Pulses: Normal pulses.     Heart sounds: Normal heart sounds. No murmur heard. Pulmonary:     Effort: Pulmonary effort is normal.     Breath sounds: Normal breath sounds.  Abdominal:     General: Abdomen is flat.  Bowel sounds are normal.     Palpations: Abdomen is soft.  Musculoskeletal:        General: Swelling present.     Cervical back: Neck supple.  Skin:    General: Skin is warm.  Neurological:     Mental Status: She is alert and oriented to person, place, and time.  Psychiatric:        Mood and Affect: Mood normal.        Thought Content: Thought content normal.     Labs reviewed: Basic Metabolic Panel: Recent Labs    08/01/22 0000  NA 143  K 4.1  CL 105  CO2 21  BUN 19  CREATININE 1.1  CALCIUM 9.6  TSH 2.34   Liver Function Tests: Recent Labs    08/01/22 0000  AST 20  ALT 22  ALKPHOS 72  ALBUMIN 3.8   No results for input(s): "LIPASE", "AMYLASE" in the last 8760 hours. No results for input(s): "AMMONIA" in the last 8760 hours. CBC: Recent Labs    08/01/22 0000  WBC 5.0  HGB 14.4  HCT 42  PLT 277   Lipid Panel: No results for input(s): "CHOL", "HDL", "LDLCALC", "TRIG", "CHOLHDL", "LDLDIRECT" in the last 8760 hours. Lab Results  Component Value Date   HGBA1C 5.6 02/28/2021    Procedures since last visit: No results found.  Assessment/Plan 1. Primary chronic progressive multiple sclerosis (HCC) (Primary) Off All Meds Wheelchair and Hoyer dependent Has failed treatments Does not follow with Neurology Bladder and Bowel Incontinence  2. Major depression, chronic No Meds  3.  Bilateral leg edema Lasix  4. Postoperative hypothyroidism Repeat TSH  5. Weight gain Weight has stabilized She has refused portion control    Labs/tests ordered:  CBC,CMP,TSH  Next appt:  Visit date not found

## 2023-07-26 LAB — TSH: TSH: 2.12 (ref 0.41–5.90)

## 2023-07-26 LAB — LIPID PANEL
Cholesterol: 198 (ref 0–200)
HDL: 46 (ref 35–70)
LDL Cholesterol: 132
LDl/HDL Ratio: 4.3
Triglycerides: 101 (ref 40–160)

## 2023-07-26 LAB — BASIC METABOLIC PANEL WITH GFR
BUN: 20 (ref 4–21)
CO2: 25 — AB (ref 13–22)
Chloride: 102 (ref 99–108)
Creatinine: 1 (ref 0.5–1.1)
Glucose: 109
Potassium: 4 meq/L (ref 3.5–5.1)
Sodium: 139 (ref 137–147)

## 2023-07-26 LAB — COMPREHENSIVE METABOLIC PANEL WITH GFR
Albumin: 3.7 (ref 3.5–5.0)
Calcium: 9.3 (ref 8.7–10.7)
Globulin: 2.3
eGFR: 59

## 2023-07-26 LAB — HEPATIC FUNCTION PANEL
ALT: 18 U/L (ref 7–35)
AST: 18 (ref 13–35)
Alkaline Phosphatase: 64 (ref 25–125)
Bilirubin, Total: 0.2

## 2023-07-26 LAB — CBC: RBC: 4.93 (ref 3.87–5.11)

## 2023-07-26 LAB — CBC AND DIFFERENTIAL
HCT: 42 (ref 36–46)
Hemoglobin: 14.3 (ref 12.0–16.0)
Platelets: 275 K/uL (ref 150–400)
WBC: 5

## 2023-10-15 ENCOUNTER — Non-Acute Institutional Stay (SKILLED_NURSING_FACILITY): Payer: Self-pay | Admitting: Internal Medicine

## 2023-10-15 ENCOUNTER — Encounter: Payer: Self-pay | Admitting: Internal Medicine

## 2023-10-15 DIAGNOSIS — R600 Localized edema: Secondary | ICD-10-CM | POA: Diagnosis not present

## 2023-10-15 DIAGNOSIS — F329 Major depressive disorder, single episode, unspecified: Secondary | ICD-10-CM

## 2023-10-15 DIAGNOSIS — G35 Multiple sclerosis: Secondary | ICD-10-CM | POA: Diagnosis not present

## 2023-10-15 DIAGNOSIS — E890 Postprocedural hypothyroidism: Secondary | ICD-10-CM

## 2023-10-15 NOTE — Progress Notes (Signed)
 Location:  Medical illustrator of Service:  SNF (31)  Provider:   Code Status: DNR Goals of Care:     01/29/2023    4:16 PM  Advanced Directives  Does Patient Have a Medical Advance Directive? Yes  Type of Estate agent of [place];Out of facility DNR (pink MOST or yellow form)  Does patient want to make changes to medical advance directive? No - Patient declined  Copy of Healthcare Power of Attorney in Chart? Yes - validated most recent copy scanned in chart (See row information)     Chief Complaint  Patient presents with   Care Management    HPI: Patient is a 78 y.o. female seen today for medical management of chronic diseases.   Lives in SNF in [place]   Patient has h/o  Progressive MS Has Failed previous treatment Is off all meds H/o Breast Cancer s/p Bilateral Mastectomy Prediabetic Venous Deficiency, Neurogenic Bladder Hypothyroidism  Stays in her room Has no issues Uses Power chair Hoyer dependent  No Nursing issues She refuses for them to change her Diaper except 2/day Wt Readings from Last 3 Encounters:  10/15/23 257 lb (116.6 kg)  07/23/23 258 lb (117 kg)  04/16/23 255 lb (115.7 kg)      Past Medical History:  Diagnosis Date   Acute bronchitis    Per [facility])    Age-related osteoporosis without current pathological fracture    Per [facility])    Anemia, unspecified    Per [facility])    Atherosclerotic heart disease of 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    Per [facility])    Breast cancer (HCC) 10/08   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   Cervical atypia    Cervical atypia    Per records received from [facility]    Constipation    Per [facility])    Edema    Per  [facility])    Fracture of right lower leg 02/2012   NO SURGERY   Goiter    On synthroid     History of toxic multinodular goiter    Hypertension    Hypertensive nephropathy    Per [facility])    Hypothyroidism, unspecified    Per [facility])    Multinodular goiter    Per records received from [facility]   Multiple sclerosis (HCC)    PRIMARY PROGRESSIVE- NEUROLOGIST IS DR. Arta Lark  WITH [facility]; -INDEPENDENT LIVING USING POWER CHAIR - DRIVES - WORKS- NOT ABLE TO AMBULATE- CAN PULL UP AND TRANSFER.; WEARS DIAPER - UNABLE TO GET TO BATHROOM.   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)    Per [facility])    Vitamin D  deficiency    Per  [facility])     Past Surgical History:  Procedure Laterality Date   bone spur     removed from rt large toe   BREAST LUMPECTOMY Right 12/06/2006   Per records received form [facility]    CATARACT EXTRACTION, BILATERAL     CERVICAL CONIZATION W/BX  1973   KNEE ARTHROSCOPY W/ MENISCAL REPAIR  2002   MASTECTOMY, RADICAL  10/08   BILATERAL  ( RIGHT BREAST CANCER )   THYROIDECTOMY N/A 04/23/2014   Procedure: TOTAL THYROIDECTOMY;  Surgeon: Oralee Billow, MD;  Location: [facility];  Service: General;  Laterality: N/A;   TUBAL LIGATION  1979   Per records received form [facility]     Allergies  Allergen Reactions   Losartan  Potassium     Decreased GFR, per Dr. Willye Harvey    Outpatient Encounter Medications as of 10/15/2023  Medication Sig   acetaminophen  (TYLENOL ) 325 MG tablet Take 650 mg by mouth every 4 (four) hours as needed.   acetaminophen  (TYLENOL ) 325 MG tablet Take 650 mg by mouth at bedtime.   ascorbic acid (VITAMIN C) 500 MG tablet Take 500 mg by mouth daily.   Calcium  Carb-Cholecalciferol  (CALCIUM -VITAMIN D ) 500-200 MG-UNIT tablet Take 1 tablet by mouth daily.   Cyanocobalamin  1000 MCG TBCR Take 1 tablet by mouth daily.    furosemide  (LASIX ) 40 MG tablet Take 40 mg by mouth daily.   levothyroxine  (SYNTHROID ) 150 MCG tablet Take 150 mcg by mouth daily before breakfast.   No facility-administered encounter medications on file as of 10/15/2023.    Review of Systems:  Review of Systems  Constitutional:  Negative for activity change and appetite change.  HENT: Negative.    Respiratory:  Negative for cough and shortness of breath.   Cardiovascular:  Negative for leg swelling.  Gastrointestinal:  Negative for constipation.  Genitourinary: Negative.   Musculoskeletal:  Positive for gait problem. Negative for arthralgias and myalgias.  Skin: Negative.   Neurological:  Negative for dizziness and weakness.  Psychiatric/Behavioral:  Positive for dysphoric mood. Negative for confusion and sleep disturbance.     Health Maintenance  Topic Date Due   Medicare Annual Wellness (AWV)  Never done   DTaP/Tdap/Td (3 - Td or Tdap) 06/08/2022   COVID-19 Vaccine (6 - 2024-25 season) 01/07/2023   INFLUENZA VACCINE  12/07/2023   Pneumonia Vaccine 70+ Years old  Completed   DEXA SCAN  Completed   Zoster Vaccines- Shingrix  Completed   HPV VACCINES  Aged Out   Meningococcal B Vaccine  Aged Out   Hepatitis C Screening  Discontinued    Physical Exam: Vitals:   10/15/23 1318  BP: 123/79  Pulse: 87  Resp: 18  Temp: 98.6 F (37 C)  Weight: 257 lb  (116.6 kg)   Body mass index is 44.11 kg/m. Physical Exam Vitals reviewed.  Constitutional:      Appearance: Normal appearance.  HENT:     Head: Normocephalic.     Nose: Nose normal.     Mouth/Throat:     Mouth: Mucous membranes are moist.     Pharynx: Oropharynx is clear.  Eyes:     Pupils: Pupils are equal, round, and reactive to light.  Cardiovascular:     Rate and Rhythm: Normal rate and regular rhythm.     Pulses: Normal pulses.     Heart sounds: Normal heart sounds. No murmur heard. Pulmonary:     Effort: Pulmonary effort is normal.  Breath sounds: Normal breath sounds.  Abdominal:     General: Abdomen is flat. Bowel sounds are normal.     Palpations: Abdomen is soft.  Musculoskeletal:        General: Swelling present.     Cervical back: Neck supple.  Skin:    General: Skin is warm.  Neurological:     Mental Status: She is alert and oriented to person, place, and time.  Psychiatric:        Mood and Affect: Mood normal.        Thought Content: Thought content normal.     Labs reviewed: Basic Metabolic Panel: No results for input(s): "NA", "K", "CL", "CO2", "GLUCOSE", "BUN", "CREATININE", "CALCIUM ", "MG", "PHOS", "TSH" in the last 8760 hours. Liver Function Tests: No results for input(s): "AST", "ALT", "ALKPHOS", "BILITOT", "PROT", "ALBUMIN" in the last 8760 hours. No results for input(s): "LIPASE", "AMYLASE" in the last 8760 hours. No results for input(s): "AMMONIA" in the last 8760 hours. CBC: No results for input(s): "WBC", "NEUTROABS", "HGB", "HCT", "MCV", "PLT" in the last 8760 hours. Lipid Panel: No results for input(s): "CHOL", "HDL", "LDLCALC", "TRIG", "CHOLHDL", "LDLDIRECT" in the last 8760 hours. Lab Results  Component Value Date   HGBA1C 5.6 02/28/2021    Procedures since last visit: No results found.  Assessment/Plan 1. Primary chronic progressive multiple sclerosis (HCC) (Primary) Off All meds  Wheelchair and Hoyer dependent Does not  follow with Neurology Failed treatments Bladder and Bowel Incontinence  2. Major depression, chronic Refuses Antidepressant  3. Bilateral leg edema On Lasix   4. Postoperative hypothyroidism TSH Normal in 3/25    Labs/tests ordered:  * No order type specified * Next appt:  Visit date not found

## 2023-11-22 ENCOUNTER — Non-Acute Institutional Stay (SKILLED_NURSING_FACILITY): Payer: Self-pay | Admitting: Adult Health

## 2023-11-22 ENCOUNTER — Encounter: Payer: Self-pay | Admitting: Adult Health

## 2023-11-22 DIAGNOSIS — T148XXA Other injury of unspecified body region, initial encounter: Secondary | ICD-10-CM | POA: Diagnosis not present

## 2023-11-22 DIAGNOSIS — B372 Candidiasis of skin and nail: Secondary | ICD-10-CM

## 2023-11-22 MED ORDER — FLUCONAZOLE 150 MG PO TABS: 150.0000 mg | ORAL_TABLET | ORAL | Status: DC

## 2023-11-22 NOTE — Progress Notes (Signed)
 [facility] Room Number: 137 P Place of Service:  SNF (31) Provider:  Tawni America, NP    Patient Care Team: Colleen Fredia CROME, MD as PCP - General (Internal Medicine) America Tawni, NP as Nurse Practitioner (Nurse Practitioner)  Extended Emergency Contact Information Primary Emergency Contact: Colleen,[name]  [place] Home Phone: [phone] Relation: Daughter  Code Status:  DNR Goals of care: Advanced Directive information    01/29/2023    4:16 PM  Advanced Directives  Does Patient Have a Medical Advance Directive? Yes  Type of Estate agent of [place];Out of facility DNR (pink MOST or yellow form)  Does patient want to make changes to medical advance directive? No - Patient declined  Copy of Healthcare Power of Attorney in Chart? Yes - validated most recent copy scanned in chart (See row information)     Chief Complaint  Patient presents with   Acute Visit    Skin redness    HPI:  Pt is a 78 y.o. female seen today for an acute visit for skin concern  Her nurse reports she has been having significant redness and irritation to her vaginal and buttock area.  She is also has an open area and some discomfort.  She has progressive MS and incontinence. Has refused incontinence care routinely only allows changing twice daily. No dysuria upon urination. No bladder pain or hematuria.    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   Cervical atypia    Cervical atypia    Per records received from [facility]    Constipation    Per [facility])    Edema    Per [facility])    Fracture of right lower leg 02/2012   NO SURGERY   Goiter    On synthroid     History of toxic multinodular goiter    Hypertension    Hypertensive nephropathy    Per [facility])    Hypothyroidism, unspecified    Per [facility])    Multinodular goiter    Per records received from [facility]   Multiple sclerosis (HCC)    PRIMARY PROGRESSIVE- NEUROLOGIST IS DR. DAMIEN HIVES  WITH [facility]; -INDEPENDENT LIVING USING POWER CHAIR - DRIVES - WORKS- NOT ABLE TO AMBULATE- CAN PULL UP AND TRANSFER.; WEARS DIAPER - UNABLE TO GET TO BATHROOM.   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)    Per [facility])    Vitamin D  deficiency    Per [facility])    Past Surgical History:  Procedure Laterality Date   bone spur     removed from rt large toe   BREAST LUMPECTOMY Right 12/06/2006   Per records received form [facility]    CATARACT EXTRACTION, BILATERAL     CERVICAL CONIZATION W/BX  1973   KNEE ARTHROSCOPY W/ MENISCAL REPAIR  2002   MASTECTOMY, RADICAL  10/08   BILATERAL  ( RIGHT  BREAST CANCER )   THYROIDECTOMY N/A 04/23/2014   Procedure: TOTAL THYROIDECTOMY;  Surgeon: Krystal Spinner, MD;  Location: [facility];  Service: General;  Laterality: N/A;   TUBAL LIGATION  1979   Per records received form [facility]     Allergies  Allergen Reactions   Losartan  Potassium     Decreased GFR, per Dr. Roseann    Outpatient Encounter Medications as of 11/22/2023  Medication Sig   acetaminophen  (TYLENOL ) 325 MG tablet Take 650 mg by mouth every 4 (four) hours as needed.   acetaminophen  (TYLENOL ) 325 MG tablet Take 650 mg by mouth at bedtime.   ascorbic acid (VITAMIN C) 500 MG tablet Take 500 mg by mouth daily.   Calcium  Carb-Cholecalciferol  (CALCIUM -VITAMIN D ) 500-200 MG-UNIT tablet Take 1 tablet by mouth daily.   Cyanocobalamin  1000 MCG TBCR Take 1 tablet by mouth daily.    furosemide  (LASIX ) 40 MG tablet Take 40 mg by mouth daily.   levothyroxine  (SYNTHROID ) 150 MCG tablet Take 150 mcg by mouth daily before breakfast.   nystatin (MYCOSTATIN/NYSTOP) powder Apply 1 Application topically in the morning and at bedtime.   No facility-administered encounter medications on file as of 11/22/2023.    Review of Systems  Genitourinary:  Positive for vaginal pain. Negative for difficulty urinating, dysuria, flank pain and hematuria.  Skin:  Positive for color change, rash and wound.    Immunization History  Administered Date(s) Administered   Fluad Quad(high Dose 65+) 02/09/2021, 03/13/2022   Influenza Split 02/09/2012   Influenza, High Dose Seasonal PF 02/14/2019, 02/27/2020   Influenza-Unspecified 01/25/2017, 02/02/2018, 02/27/2023   Moderna Covid-19 Vaccine Bivalent Booster 49yrs & up 09/04/2021   Moderna Sars-Covid-2 Vaccination 05/20/2019, 06/18/2019, 03/24/2020, 02/16/2021   Pneumococcal Conjugate-13 07/28/2013   Pneumococcal Polysaccharide-23 03/20/2007, 02/09/2012   Tdap 11/27/2002   Tetanus 06/08/2012   Unspecified SARS-COV-2 Vaccination 02/27/2023   Zoster  Recombinant(Shingrix) 07/27/2014, 03/29/2015   Zoster, Live 07/06/2012   Pertinent  Health Maintenance Due  Topic Date Due   INFLUENZA VACCINE  12/07/2023   DEXA SCAN  Completed      01/29/2023    4:14 PM  Fall Risk  Falls in the past year? 0  Was there an injury with Fall? 0  Fall Risk Category Calculator 0  Patient at Risk for Falls Due to No Fall Risks  Fall risk Follow up Falls evaluation completed   Functional Status Survey:    Vitals:   11/22/23 1130  BP: (!) 161/84  Pulse: 85  Resp: 18  Temp: 97.8 F (36.6 C)  SpO2: 96%  Weight: 256 lb (116.1 kg)  Height: 5' 4 (1.626 m)   Body mass index is 43.94 kg/m. Physical Exam Constitutional:      General: She is not in acute distress.    Appearance: She is not diaphoretic.  Neck:     Vascular: No JVD.  Abdominal:  General: Bowel sounds are normal. There is no distension.     Palpations: Abdomen is soft.     Tenderness: There is no abdominal tenderness.  Musculoskeletal:     Comments: BLE edema with bluish discoloration to her toes Also has ecchymosis to right calf area.   Skin:    General: Skin is warm and dry.     Findings: Erythema and rash present.  Neurological:     Mental Status: She is alert and oriented to person, place, and time.     Labs reviewed: Recent Labs    07/26/23 0000  NA 139  K 4.0  CL 102  CO2 25*  BUN 20  CREATININE 1.0  CALCIUM  9.3   Recent Labs    07/26/23 0000  AST 18  ALT 18  ALKPHOS 64  ALBUMIN 3.7   Recent Labs    07/26/23 0000  WBC 5.0  HGB 14.3  HCT 42  PLT 275   Lab Results  Component Value Date   TSH 2.12 07/26/2023   Lab Results  Component Value Date   HGBA1C 5.6 02/28/2021   Lab Results  Component Value Date   CHOL 198 07/26/2023   HDL 46 07/26/2023   LDLCALC 132 07/26/2023   TRIG 101 07/26/2023    Significant Diagnostic Results in last 30 days:  No results found.  Assessment/Plan   1. Yeast dermatitis (Primary)  - fluconazole   (DIFLUCAN ) 150 MG tablet; Take 1 tablet (150 mg total) by mouth every other day.  2. Skin excoriation Needs to change when wet and keep dry Recommend changing at least 4 or more times a day due to incontinence but she declined Also refused turning in bed Did agree to one additional change per day to make 3

## 2024-01-21 ENCOUNTER — Encounter: Payer: Self-pay | Admitting: Internal Medicine

## 2024-01-21 ENCOUNTER — Non-Acute Institutional Stay (SKILLED_NURSING_FACILITY): Payer: Self-pay | Admitting: Internal Medicine

## 2024-01-21 DIAGNOSIS — G35 Multiple sclerosis: Secondary | ICD-10-CM

## 2024-01-21 DIAGNOSIS — E890 Postprocedural hypothyroidism: Secondary | ICD-10-CM

## 2024-01-21 DIAGNOSIS — R600 Localized edema: Secondary | ICD-10-CM

## 2024-01-21 DIAGNOSIS — F329 Major depressive disorder, single episode, unspecified: Secondary | ICD-10-CM

## 2024-01-21 DIAGNOSIS — T148XXA Other injury of unspecified body region, initial encounter: Secondary | ICD-10-CM

## 2024-01-21 NOTE — Progress Notes (Signed)
 [facility] Room Number: 137-P Place of Service:  SNF [phone]) Provider:  Krystal Bring  PCP: Bring Fredia CROME, MD  Patient Care Team: Bring Fredia CROME, MD as PCP - General (Internal Medicine) Darlean Maus, NP as Nurse Practitioner (Nurse Practitioner)  Extended Emergency Contact Information Primary Emergency Contact: [name],[name]  [place] Home Phone: [phone] Relation: Daughter  Code Status:  DNR Goals of care: Advanced Directive information    01/21/2024   11:58 AM  Advanced Directives  Does Patient Have a Medical Advance Directive? Yes  Type of Estate agent of [place];Living will;Out of facility DNR (pink MOST or yellow form)  Does patient want to make changes to medical advance directive? No - Patient declined  Copy of Healthcare Power of Attorney in Chart? Yes - validated most recent copy scanned in chart (See row information)     Chief Complaint  Patient presents with   Medical Management of Chronic Issues    Routine visit. Discuss the need for DTAP vaccine, Influenza vaccine, Covid Booster, and AWV.    HPI:  Pt is a 78 y.o. female seen today for medical management of chronic diseases.    Lives in SNF in [place]   Patient has h/o  Progressive MS Has Failed previous treatment Is off all meds H/o Breast Cancer s/p Bilateral Mastectomy Prediabetic Venous insufficiency, Neurogenic Bladder Hypothyroidism  Patient recently was treated for Yeast dermatitis.  Patient refuses to allow changing of her diaper She was treated with Diflucan  She has agreed to more frequent diaper changes.  Patient also has excoriation of her skin in the bottom the nurses is using DuoDERM Patient did not have any complaints for me she does not like to go over her history. Otherwise she is stable and uses her power chair.  Mostly stays in her room Wt Readings from Last 3 Encounters:  01/21/24 251 lb 6.4  oz (114 kg)  11/22/23 256 lb (116.1 kg)  10/15/23 257 lb (116.6 kg)    Past Medical History:  Diagnosis Date   Acute bronchitis    Per [facility])    Age-related osteoporosis without current pathological fracture    Per [facility])    Anemia, unspecified    Per [facility])    Atherosclerotic heart disease of 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    Per [facility])    Breast cancer (HCC) 10/08   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   Cervical atypia    Cervical atypia    Per [facility] received from [facility]    Constipation    Per [facility])    Edema    Per [facility])    Fracture of right lower leg 02/2012   NO SURGERY   Goiter    On synthroid     History of toxic multinodular goiter    Hypertension    Hypertensive nephropathy    Per [facility])    Hypothyroidism, unspecified    Per [facility])    Multinodular goiter    Per records received from [facility]   Multiple sclerosis (HCC)    PRIMARY PROGRESSIVE- NEUROLOGIST IS DR. DAMIEN HIVES  WITH [facility]; -INDEPENDENT LIVING USING POWER CHAIR - DRIVES - WORKS- NOT ABLE TO AMBULATE-  CAN PULL UP AND TRANSFER.; WEARS DIAPER - UNABLE TO GET TO BATHROOM.   Obesity    Other specified fracture of right pubis, initial encounter for closed fracture (HCC)    Per [facility])    Prediabetes    Per [facility])    Renal disease    stage 3   Stage 3 chronic kidney disease (HCC)    Per records received from [facility]   Thyroid  nodule    Tibia/fibula fracture, right,  closed, initial encounter    Varicose veins of left lower extremity with ulcer (HCC)    Per [facility] records [facility])    Vitamin D  deficiency    Per [facility])    Past Surgical History:  Procedure Laterality Date   bone spur     removed from rt large toe   BREAST LUMPECTOMY Right 12/06/2006   Per records received form [facility]    CATARACT EXTRACTION, BILATERAL     CERVICAL CONIZATION W/BX  1973   KNEE ARTHROSCOPY W/ MENISCAL REPAIR  2002   MASTECTOMY, RADICAL  10/08   BILATERAL  ( RIGHT BREAST CANCER )   THYROIDECTOMY N/A 04/23/2014   Procedure: TOTAL THYROIDECTOMY;  Surgeon: Krystal Spinner, MD;  Location: [facility];  Service: General;  Laterality: N/A;   TUBAL LIGATION  1979   Per records received form [facility]     Allergies  Allergen Reactions   Losartan  Potassium     Decreased GFR, per Dr. Roseann    Allergies as of 01/21/2024       Reactions   Losartan  Potassium    Decreased GFR, per Dr. Roseann        Medication List        Accurate as of January 21, 2024 11:58 AM. If you have any questions, ask your nurse or doctor.          acetaminophen  325 MG tablet Commonly known as: TYLENOL  Take 650 mg by mouth every 4 (four) hours as needed.   acetaminophen  325 MG tablet Commonly known as: TYLENOL  Take 650 mg by mouth at bedtime.   ascorbic acid 500 MG tablet Commonly known as: VITAMIN C Take 500 mg by mouth daily.   calcium -vitamin D  500-200 MG-UNIT tablet Take 1 tablet by mouth daily.   Cyanocobalamin  1000 MCG Tbcr Take 1 tablet by mouth daily.   fluconazole  150 MG tablet Commonly known as: DIFLUCAN  Take 1 tablet (150 mg total) by mouth every other day.   furosemide  40 MG tablet Commonly known as: LASIX  Take 40 mg by mouth daily.   levothyroxine  150 MCG tablet Commonly known as: SYNTHROID  Take 150 mcg by mouth daily before breakfast.   nystatin  powder Commonly known as: MYCOSTATIN/NYSTOP Apply 1 Application topically in the morning and at bedtime.        Review of Systems  Constitutional:  Negative for activity change and appetite change.  HENT: Negative.    Respiratory:  Negative for cough and shortness of breath.   Cardiovascular:  Positive for leg swelling.  Gastrointestinal:  Negative for constipation.  Genitourinary: Negative.   Musculoskeletal:  Positive for gait problem. Negative for arthralgias and myalgias.  Skin:  Positive for wound.  Neurological:  Negative for dizziness and weakness.  Psychiatric/Behavioral:  Positive for dysphoric mood. Negative for confusion and sleep disturbance.     Immunization History  Administered Date(s) Administered   Fluad Quad(high Dose 65+)  02/09/2021, 03/13/2022   INFLUENZA, HIGH DOSE SEASONAL PF 02/14/2019, 02/27/2020   Influenza Split 02/09/2012   Influenza-Unspecified 01/25/2017, 02/02/2018, 02/27/2023   Moderna Covid-19 Vaccine Bivalent Booster 17yrs & up 09/04/2021   Moderna Sars-Covid-2 Vaccination 05/20/2019, 06/18/2019, 03/24/2020, 02/16/2021   Pneumococcal Conjugate-13 07/28/2013   Pneumococcal Polysaccharide-23 03/20/2007, 02/09/2012   Tdap 11/27/2002   Tetanus 06/08/2012   Unspecified SARS-COV-2 Vaccination 02/27/2023   Zoster Recombinant(Shingrix) 07/27/2014, 03/29/2015   Zoster, Live 07/06/2012   Pertinent  Health Maintenance Due  Topic Date Due   Influenza Vaccine  12/07/2023   DEXA SCAN  Completed   Mammogram  Discontinued      01/29/2023    4:14 PM  Fall Risk  Falls in the past year? 0  Was there an injury with Fall? 0  Fall Risk Category Calculator 0  Patient at Risk for Falls Due to No Fall Risks  Fall risk Follow up Falls evaluation completed   Functional Status Survey:    Vitals:   01/21/24 1155  BP: (!) 142/95  Pulse: 95  Resp: 18  Temp: 98.2 F (36.8 C)  SpO2: 95%  Weight: 251 lb 6.4 oz (114 kg)  Height: 5' 4 (1.626 m)   Body  mass index is 43.15 kg/m. Physical Exam Vitals reviewed.  Constitutional:      Appearance: Normal appearance.  HENT:     Head: Normocephalic.     Nose: Nose normal.     Mouth/Throat:     Mouth: Mucous membranes are moist.     Pharynx: Oropharynx is clear.  Eyes:     Pupils: Pupils are equal, round, and reactive to light.  Cardiovascular:     Rate and Rhythm: Normal rate and regular rhythm.     Pulses: Normal pulses.     Heart sounds: Normal heart sounds. No murmur heard. Pulmonary:     Effort: Pulmonary effort is normal.     Breath sounds: Normal breath sounds.  Abdominal:     General: Abdomen is flat. Bowel sounds are normal.     Palpations: Abdomen is soft.  Musculoskeletal:        General: Swelling present.     Cervical back: Neck supple.  Skin:    General: Skin is warm.  Neurological:     Mental Status: She is alert and oriented to person, place, and time.  Psychiatric:        Mood and Affect: Mood normal.        Thought Content: Thought content normal.     Labs reviewed: Recent Labs    07/26/23 0000  NA 139  K 4.0  CL 102  CO2 25*  BUN 20  CREATININE 1.0  CALCIUM  9.3   Recent Labs    07/26/23 0000  AST 18  ALT 18  ALKPHOS 64  ALBUMIN 3.7   Recent Labs    07/26/23 0000  WBC 5.0  HGB 14.3  HCT 42  PLT 275   Lab Results  Component Value Date   TSH 2.12 07/26/2023   Lab Results  Component Value Date   HGBA1C 5.6 02/28/2021   Lab Results  Component Value Date   CHOL 198 07/26/2023   HDL 46 07/26/2023   LDLCALC 132 07/26/2023   TRIG 101 07/26/2023    Significant Diagnostic Results in last 30 days:  No results found.  Assessment/Plan 1. Skin excoriation (Primary) Using Duoderm Mostly as she resists Diaper changes  2. Primary chronic progressive multiple sclerosis (HCC) Off All meds  Wheelchair and  Hoyer dependent Does not follow with Neurology Failed treatments Bladder and Bowel Incontinence  3. Major depression,  chronic Refuses Antidepressant  4. Bilateral leg edema Lasix   5. Postoperative hypothyroidism TSH normal in 3/25    Family/ staff Communication:   Labs/tests ordered:

## 2024-04-07 ENCOUNTER — Non-Acute Institutional Stay (SKILLED_NURSING_FACILITY): Admitting: Internal Medicine

## 2024-04-07 ENCOUNTER — Encounter: Payer: Self-pay | Admitting: Internal Medicine

## 2024-04-07 DIAGNOSIS — R600 Localized edema: Secondary | ICD-10-CM

## 2024-04-07 DIAGNOSIS — G35B0 Primary progressive multiple sclerosis, unspecified: Secondary | ICD-10-CM

## 2024-04-07 DIAGNOSIS — E890 Postprocedural hypothyroidism: Secondary | ICD-10-CM

## 2024-04-07 DIAGNOSIS — F329 Major depressive disorder, single episode, unspecified: Secondary | ICD-10-CM | POA: Diagnosis not present

## 2024-04-07 NOTE — Progress Notes (Signed)
 [facility] Room Number: 137-P Place of Service:  SNF [phone]) Provider:  Charlanne Fredia CROME, MD  Patient Care Team: Charlanne Fredia CROME, MD as PCP - General (Internal Medicine) Darlean Maus, NP as Nurse Practitioner (Nurse Practitioner)  Extended Emergency Contact Information Primary Emergency Contact: [name],[name]  [place] Home Phone: [phone] Relation: Daughter  Code Status:  DNR Goals of care: Advanced Directive information    04/07/2024   12:34 PM  Advanced Directives  Does Patient Have a Medical Advance Directive? Yes  Type of Estate Agent of [place];Living will;Out of facility DNR (pink MOST or yellow form)  Does patient want to make changes to medical advance directive? No - Patient declined  Copy of Healthcare Power of Attorney in Chart? Yes - validated most recent copy scanned in chart (See row information)  Pre-existing out of facility DNR order (yellow form or pink MOST form) Yellow form placed in chart (order not valid for inpatient use)     Chief Complaint  Patient presents with   Medical Management of Chronic Issues    Routine Visit, needs to discuss tetanus vaccine and medicare annual wellness visit    HPI:  Pt is a 78 y.o. female seen today for medical management of chronic diseases.    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 previous treatment Is off all meds H/o Breast Cancer s/p Bilateral Mastectomy Prediabetic Venous insufficiency, Neurogenic Bladder Hypothyroidism   She is Stable No New issues No Falls Stays in her room Uses Power chair Was concerned about her husband today Wt Readings from Last 3 Encounters:  04/07/24 240 lb 3.2 oz (109 kg)  01/21/24 251 lb 6.4 oz (114 kg)  11/22/23 256 lb (116.1 kg)    Past Medical History:  Diagnosis Date   Acute bronchitis    Per [facility])     Age-related osteoporosis without current pathological fracture    Per [facility])    Anemia, unspecified    Per [facility])    Atherosclerotic heart disease of 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    Per [facility])    Breast cancer (HCC) 10/08   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   Cervical atypia    Cervical atypia    Per records received from [facility]    Constipation    Per [facility])    Edema    Per [facility])    Fracture of right lower leg 02/2012   NO SURGERY   Goiter    On synthroid     History of toxic multinodular goiter    Hypertension    Hypertensive nephropathy    Per [facility])    Hypothyroidism, unspecified    Per [facility])    Multinodular goiter    Per records received from [facility]   Multiple sclerosis    PRIMARY PROGRESSIVE- NEUROLOGIST IS DR. DAMIEN HIVES  WITH [facility]; -INDEPENDENT LIVING USING POWER CHAIR - DRIVES - WORKS- NOT ABLE TO AMBULATE- CAN PULL UP AND TRANSFER.; WEARS DIAPER - UNABLE TO GET TO BATHROOM.   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)    Per [facility])    Vitamin D  deficiency    Per [facility] records system)    Past Surgical History:  Procedure Laterality Date   bone spur     removed from rt large toe   BREAST LUMPECTOMY Right 12/06/2006   Per records received form [facility]    CATARACT EXTRACTION, BILATERAL     CERVICAL CONIZATION W/BX  1973   KNEE ARTHROSCOPY W/ MENISCAL REPAIR  2002   MASTECTOMY, RADICAL  10/08   BILATERAL  ( RIGHT BREAST CANCER )   THYROIDECTOMY N/A 04/23/2014   Procedure: TOTAL THYROIDECTOMY;  Surgeon: Krystal Spinner, MD;  Location: [facility];  Service: General;  Laterality: N/A;   TUBAL LIGATION  1979   Per records received form [facility]     Allergies  Allergen Reactions   Losartan  Potassium     Decreased GFR, per Dr. Roseann    Outpatient Encounter Medications as of 04/07/2024  Medication Sig   acetaminophen  (TYLENOL ) 325 MG tablet Take 650 mg by mouth at bedtime.   ascorbic acid (VITAMIN C) 500 MG tablet Take 500 mg by mouth daily.   Calcium  Carb-Cholecalciferol  (CALCIUM -VITAMIN D ) 500-200 MG-UNIT tablet Take 1 tablet by mouth daily.   Cyanocobalamin  1000 MCG TBCR Take 1 tablet by mouth daily.    furosemide  (LASIX ) 40 MG tablet Take 40 mg by mouth daily.   levothyroxine  (SYNTHROID ) 150 MCG tablet Take 150 mcg by mouth daily before breakfast.   nystatin (MYCOSTATIN/NYSTOP) powder Apply 1 Application topically in the morning and at bedtime.   acetaminophen  (TYLENOL ) 325 MG tablet Take 650 mg by mouth every 4 (four) hours as needed. (Patient not taking: Reported on 04/07/2024)   fluconazole  (DIFLUCAN ) 150 MG tablet Take 1 tablet (150 mg total) by mouth every other day. (Patient not taking: Reported on 04/07/2024)   No facility-administered encounter medications on file as of 04/07/2024.    Review of Systems  Constitutional:  Negative for activity change and appetite change.  HENT: Negative.    Respiratory:  Negative for cough and shortness of breath.   Cardiovascular:  Positive for leg swelling.   Gastrointestinal:  Negative for constipation.  Genitourinary: Negative.   Musculoskeletal:  Positive for gait problem. Negative for arthralgias and myalgias.  Skin: Negative.   Neurological:  Negative for dizziness and weakness.  Psychiatric/Behavioral:  Negative for confusion, dysphoric mood and sleep disturbance.     Immunization History  Administered Date(s) Administered   Fluad Quad(high Dose 65+) 02/09/2021, 03/13/2022   INFLUENZA, HIGH DOSE SEASONAL PF 02/14/2019, 02/27/2020, 02/05/2024   Influenza Split 02/09/2012   Influenza-Unspecified 01/25/2017, 02/02/2018, 02/27/2023   Moderna Covid-19 Fall Seasonal Vaccine 93yrs & older 02/05/2024   Moderna Covid-19 Vaccine Bivalent Booster 7yrs & up 09/04/2021   Moderna Sars-Covid-2 Vaccination 05/20/2019, 06/18/2019, 03/24/2020, 02/16/2021   Pneumococcal Conjugate-13 07/28/2013   Pneumococcal Polysaccharide-23 03/20/2007, 02/09/2012   Tdap 11/27/2002   Tetanus 06/08/2012   Unspecified SARS-COV-2 Vaccination 02/27/2023   Zoster Recombinant(Shingrix) 07/27/2014, 03/29/2015   Zoster, Live 07/06/2012   Pertinent  Health  Maintenance Due  Topic Date Due   Influenza Vaccine  Completed   Bone Density Scan  Completed   Mammogram  Discontinued      01/29/2023    4:14 PM  Fall Risk  Falls in the past year? 0  Was there an injury with Fall? 0  Fall Risk Category Calculator 0  Patient at Risk for Falls Due to No Fall Risks  Fall risk Follow up Falls evaluation completed   Functional Status Survey:    Vitals:   04/07/24 1224  BP: 130/84  Pulse: 94  Resp: 18  Temp: 98.4 F (36.9 C)  SpO2: 97%  Weight: 240 lb 3.2 oz (109 kg)  Height: 5' 4 (1.626 m)   Body mass index is 41.23 kg/m. Physical Exam Vitals reviewed.  Constitutional:      Appearance: Normal appearance.  HENT:     Head: Normocephalic.     Nose: Nose normal.     Mouth/Throat:     Mouth: Mucous membranes are moist.     Pharynx: Oropharynx is clear.  Eyes:      Pupils: Pupils are equal, round, and reactive to light.  Cardiovascular:     Rate and Rhythm: Normal rate and regular rhythm.     Pulses: Normal pulses.     Heart sounds: Normal heart sounds. No murmur heard. Pulmonary:     Effort: Pulmonary effort is normal.     Breath sounds: Normal breath sounds.  Abdominal:     General: Abdomen is flat. Bowel sounds are normal.     Palpations: Abdomen is soft.  Musculoskeletal:        General: Swelling present.     Cervical back: Neck supple.  Skin:    General: Skin is warm.  Neurological:     Mental Status: She is alert and oriented to person, place, and time.  Psychiatric:        Mood and Affect: Mood normal.        Thought Content: Thought content normal.     Labs reviewed: Recent Labs    07/26/23 0000  NA 139  K 4.0  CL 102  CO2 25*  BUN 20  CREATININE 1.0  CALCIUM  9.3   Recent Labs    07/26/23 0000  AST 18  ALT 18  ALKPHOS 64  ALBUMIN 3.7   Recent Labs    07/26/23 0000  WBC 5.0  HGB 14.3  HCT 42  PLT 275   Lab Results  Component Value Date   TSH 2.12 07/26/2023   Lab Results  Component Value Date   HGBA1C 5.6 02/28/2021   Lab Results  Component Value Date   CHOL 198 07/26/2023   HDL 46 07/26/2023   LDLCALC 132 07/26/2023   TRIG 101 07/26/2023    Significant Diagnostic Results in last 30 days:  No results found.  Assessment/Plan 1. Primary chronic progressive multiple sclerosis (Primary) Off All meds  Wheelchair and Hoyer dependent Does not follow with Neurology Failed treatments Bladder and Bowel Incontinence  2. Major depression, chronic Refused Meds  3. Bilateral leg edema Lasix   4. Postoperative hypothyroidism TSH normal in 07/2023    Family/ staff Communication:   Labs/tests ordered:  Labs ordered
